# Patient Record
Sex: Female | Born: 1970 | Race: White | Hispanic: No | Marital: Married | Smoking: Never smoker
Health system: Southern US, Community
[De-identification: ages and names within clinical notes are randomized; demographics above are authoritative.]

## PROBLEM LIST (undated history)

## (undated) DIAGNOSIS — C4491 Basal cell carcinoma of skin, unspecified: Secondary | ICD-10-CM

## (undated) DIAGNOSIS — F419 Anxiety disorder, unspecified: Secondary | ICD-10-CM

## (undated) DIAGNOSIS — F988 Other specified behavioral and emotional disorders with onset usually occurring in childhood and adolescence: Secondary | ICD-10-CM

## (undated) DIAGNOSIS — G43909 Migraine, unspecified, not intractable, without status migrainosus: Secondary | ICD-10-CM

## (undated) HISTORY — DX: Migraine, unspecified, not intractable, without status migrainosus: G43.909

## (undated) HISTORY — DX: Basal cell carcinoma of skin, unspecified: C44.91

## (undated) HISTORY — PX: LEFT OOPHORECTOMY: SHX1961

## (undated) HISTORY — DX: Other specified behavioral and emotional disorders with onset usually occurring in childhood and adolescence: F98.8

## (undated) HISTORY — DX: Anxiety disorder, unspecified: F41.9

---

## 1994-06-01 HISTORY — PX: BREAST SURGERY: SHX581

## 1994-06-01 HISTORY — PX: BREAST ENHANCEMENT SURGERY: SHX7

## 1999-06-02 HISTORY — PX: ABDOMINAL HYSTERECTOMY: SHX81

## 2006-06-01 DIAGNOSIS — C4491 Basal cell carcinoma of skin, unspecified: Secondary | ICD-10-CM

## 2006-06-01 HISTORY — DX: Basal cell carcinoma of skin, unspecified: C44.91

## 2008-01-06 ENCOUNTER — Ambulatory Visit: Payer: Self-pay | Admitting: Internal Medicine

## 2008-12-23 LAB — HM PAP SMEAR

## 2009-03-29 ENCOUNTER — Ambulatory Visit: Payer: Self-pay | Admitting: Internal Medicine

## 2010-08-22 IMAGING — CR RIGHT TIBIA AND FIBULA - 2 VIEW
1 series · 2 of 2 positions shown · non-contrast
Comparison: none

REASON FOR EXAM: persistant pain rt tib/fib  US 1st
COMMENTS:

[Series 1: view not recorded · 0.17mm/px · 2 of 2 slices shown]
[im 1/2]
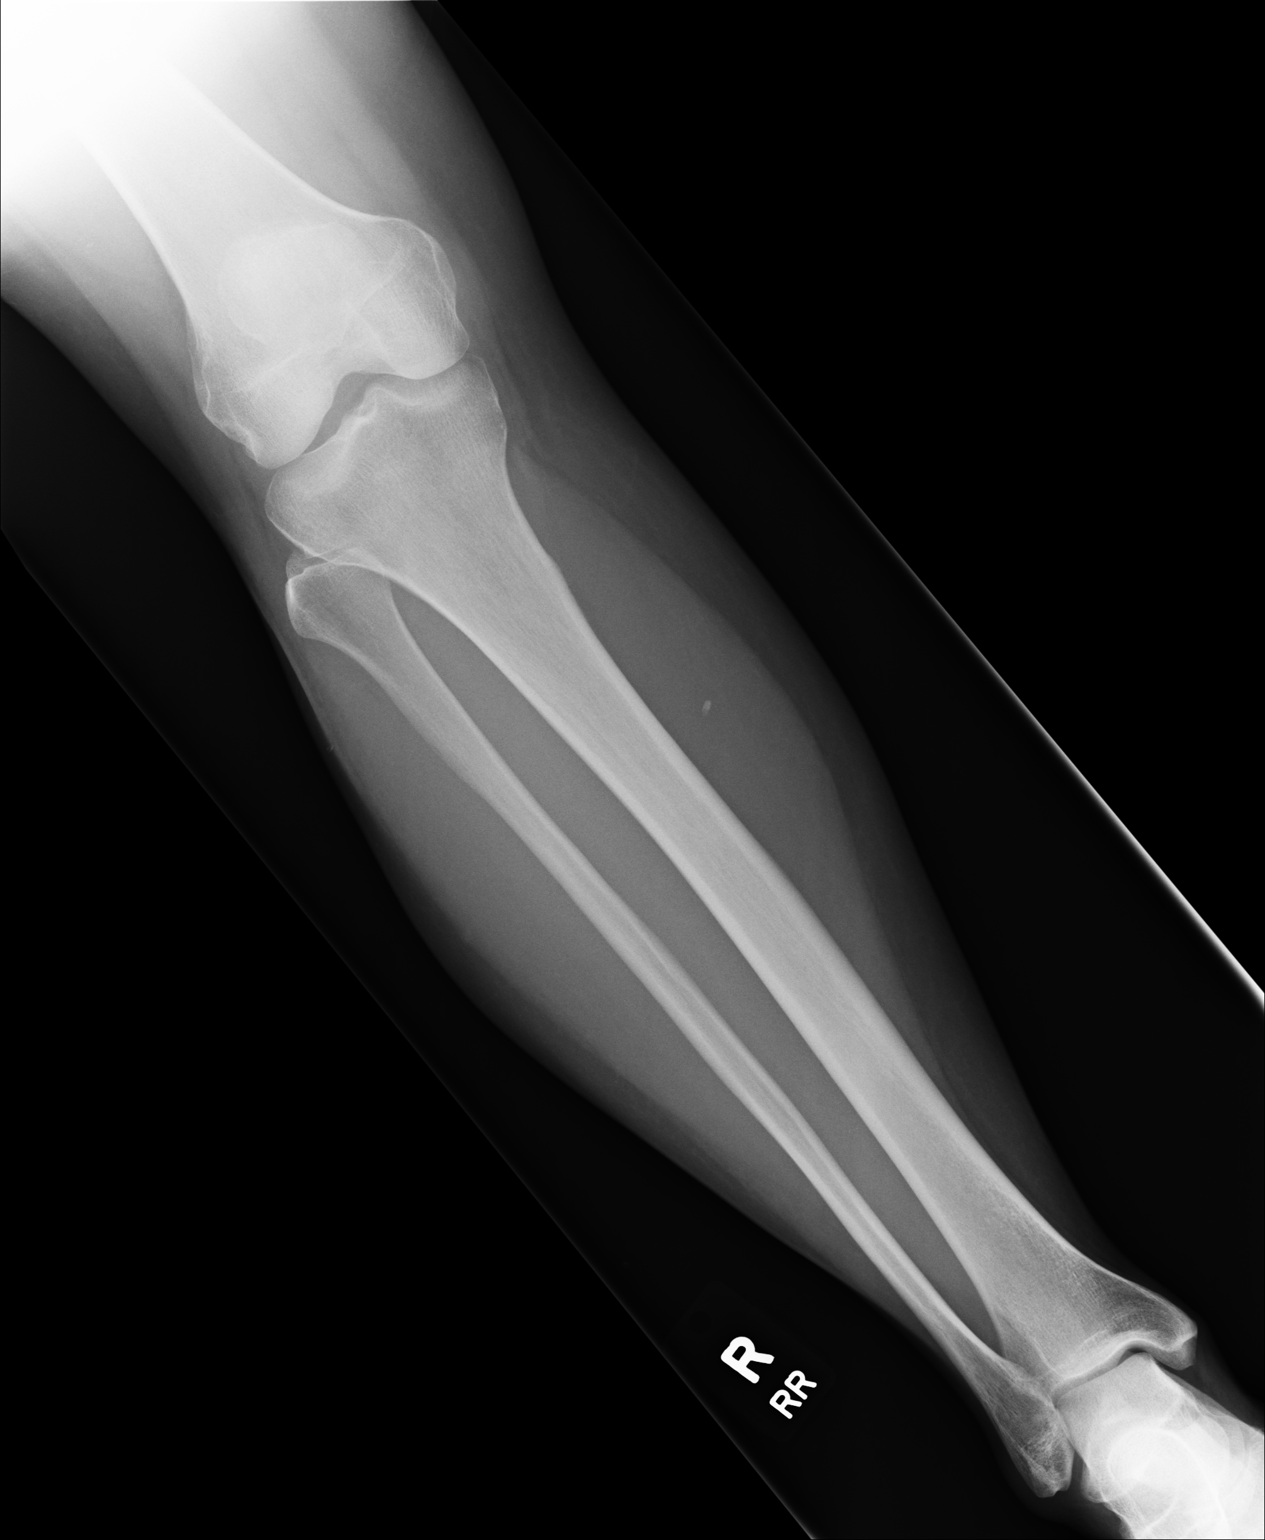
[im 2/2]
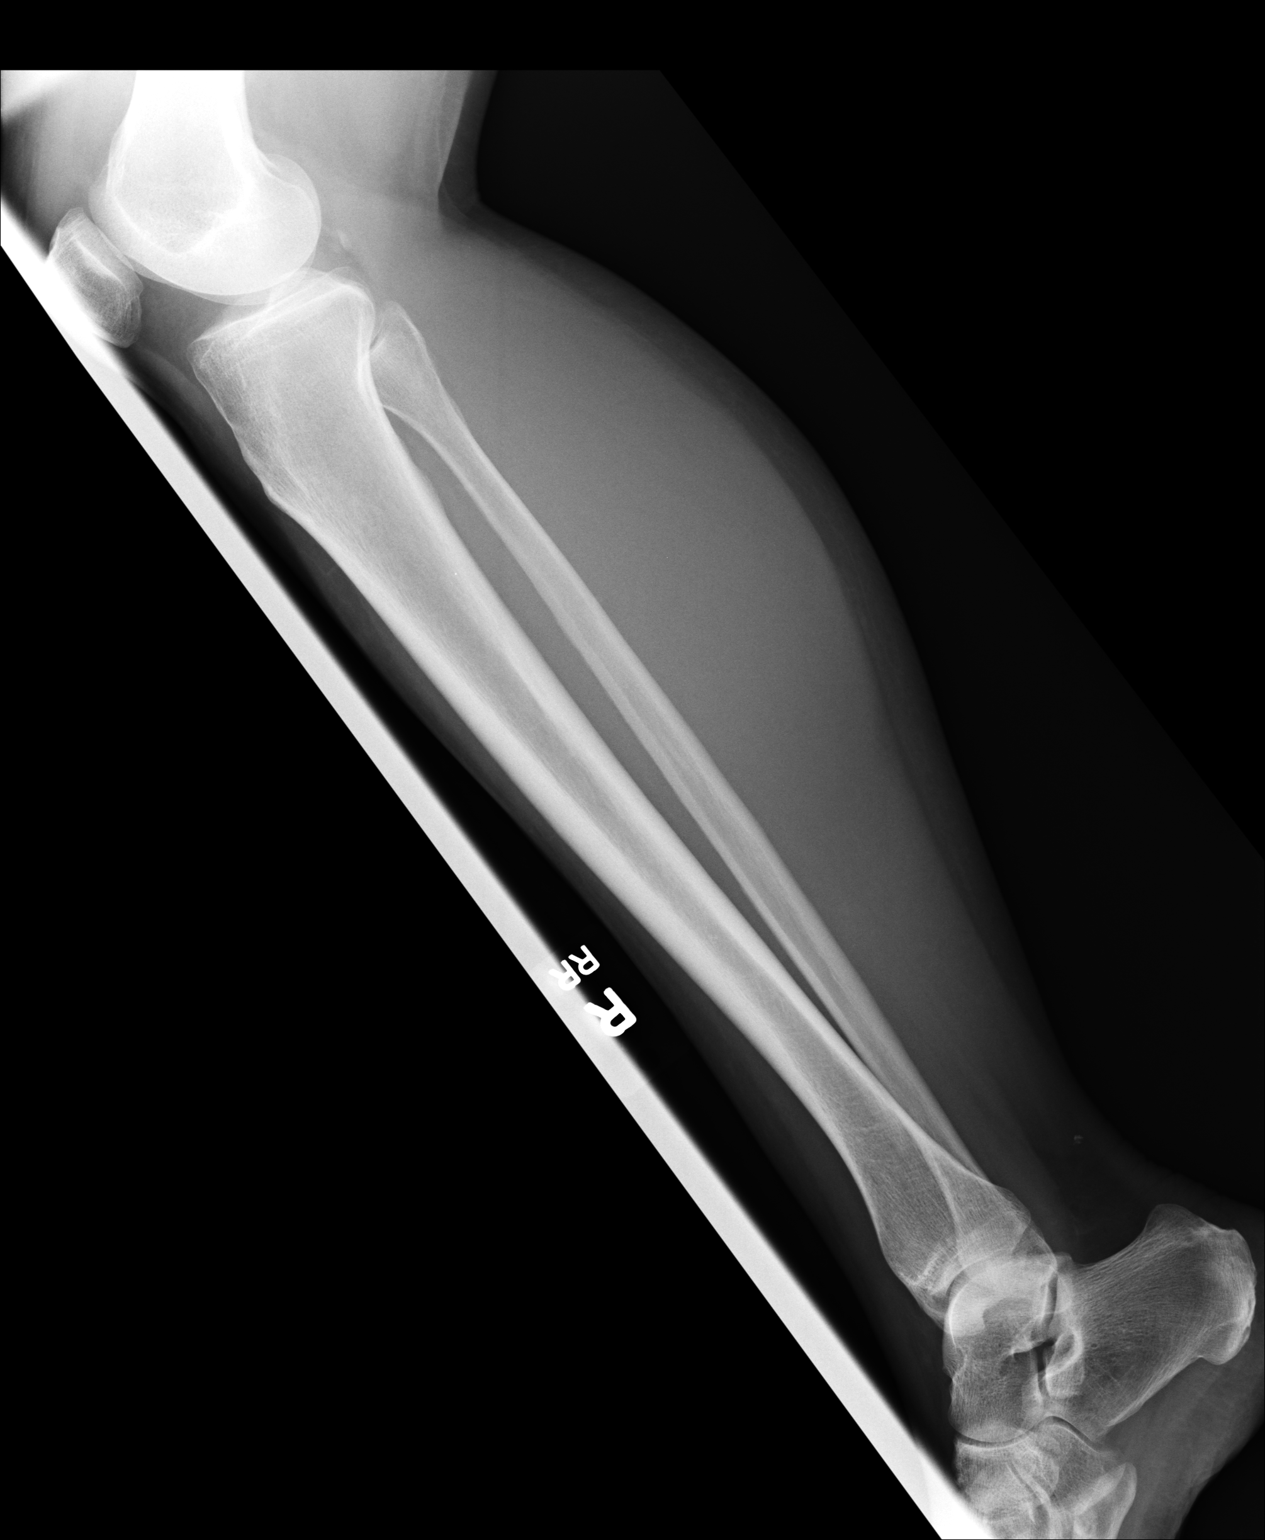

[2 of 2 positions shown; findings below may reference images not displayed]

RESULT:     AP and lateral views of the right tibia and fibula reveal the
bones to be adequately mineralized. I do not see evidence of an acute
fracture. A radiodense structure projecting over the soft tissues of the
medial aspect of the mid shin may be artifactual. It is not clearly evident
on the lateral film but it could be obscured by bony density.
IMPRESSION: I do not see evidence of an acute fracture nor healing
fracture of the right tibia or fibula. The observed portions of the knee and
ankle are normal.

## 2010-08-22 IMAGING — US TRANSABDOMINAL ULTRASOUND OF PELVIS
1 series · 17 of 25 positions shown · non-contrast
Comparison: none

REASON FOR EXAM: left tender suprainguinal mass pt has had hysterectomy
with left oophrectomy
COMMENTS:

[Series 1: transabdominal ultrasound of pelvis · 17 of 28 slices shown]
[im 1/28]
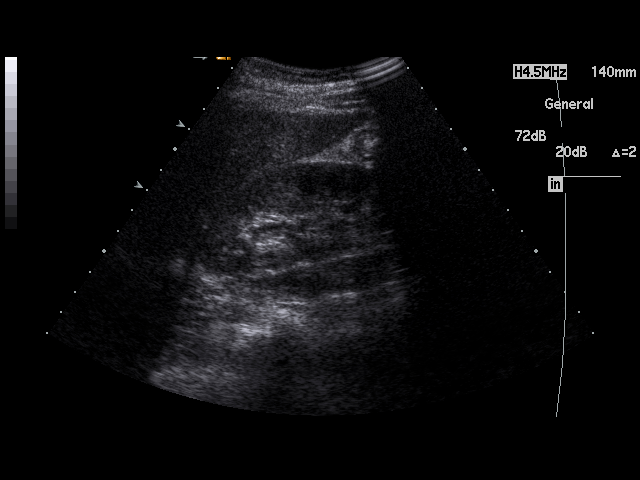
[im 3/28]
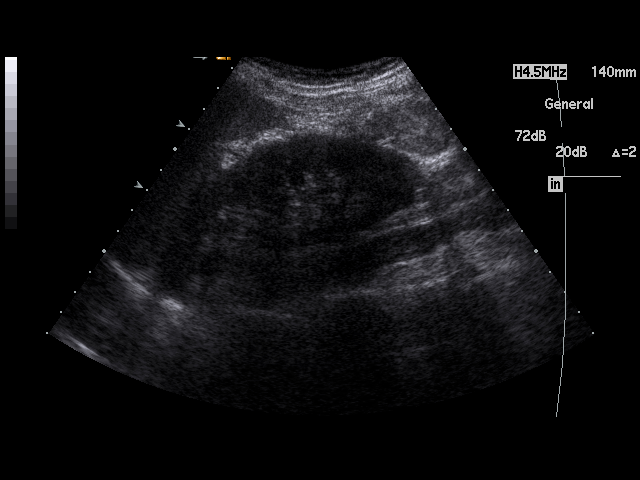
[im 4/28]
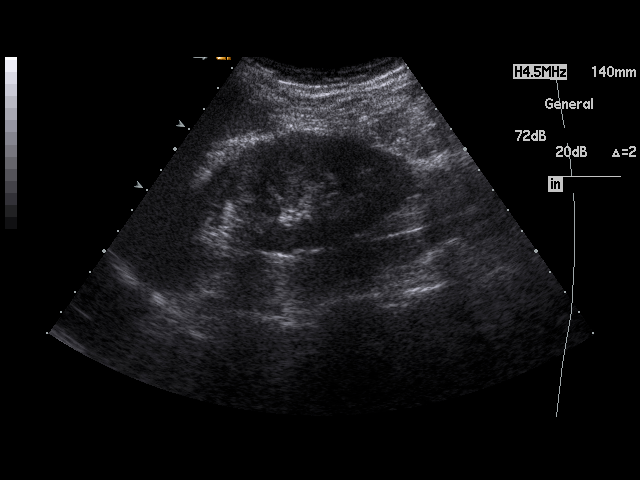
[im 6/28]
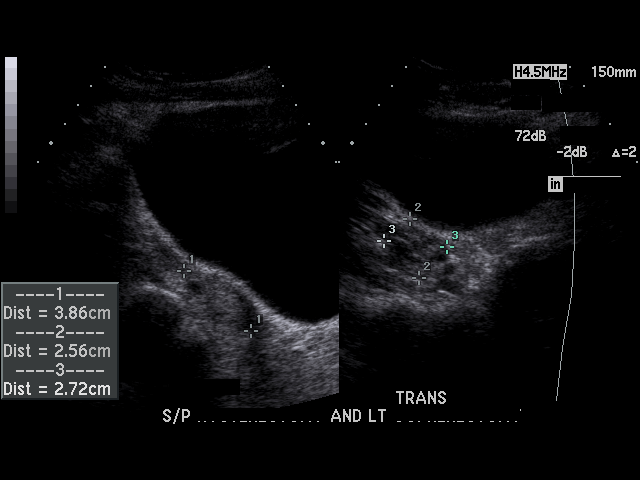
[im 7/28]
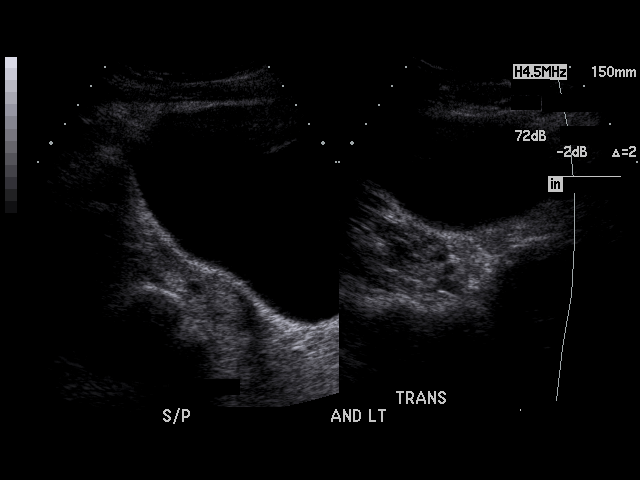
[im 10/28]
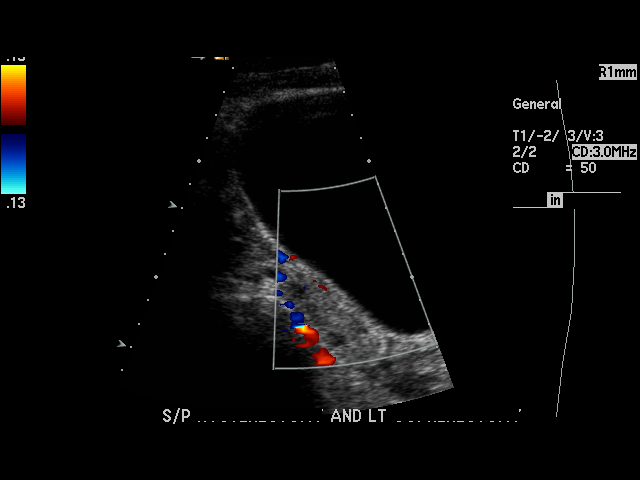
[im 11/28]
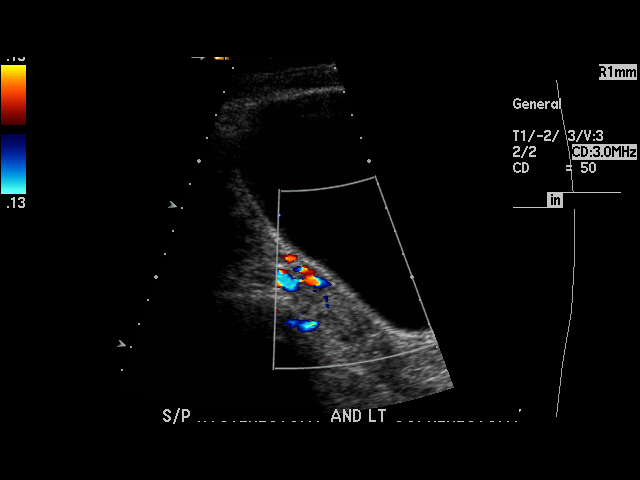
[im 13/28]
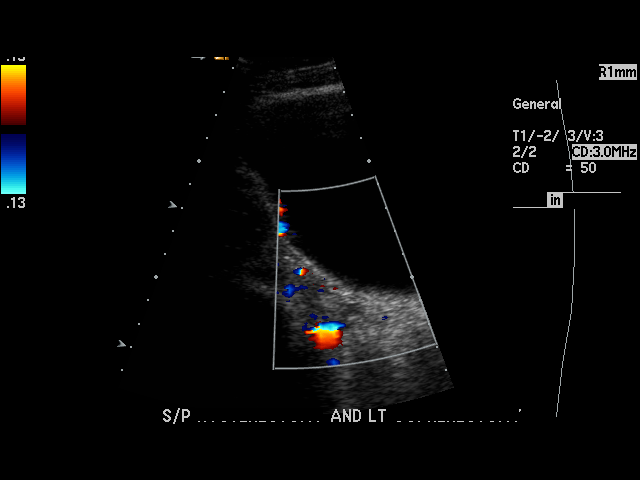
[im 14/28]
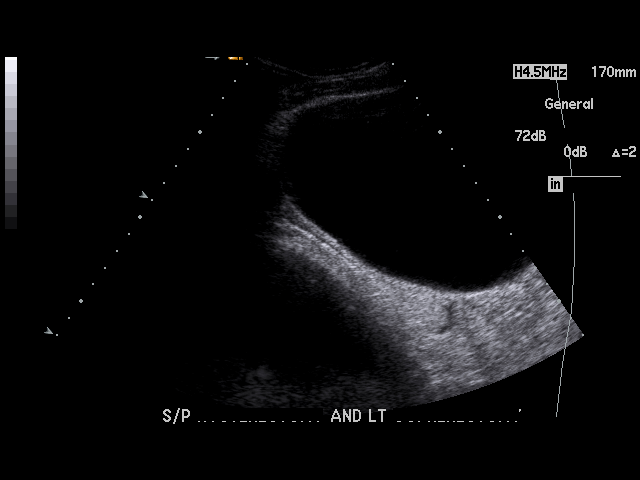
[im 15/28]
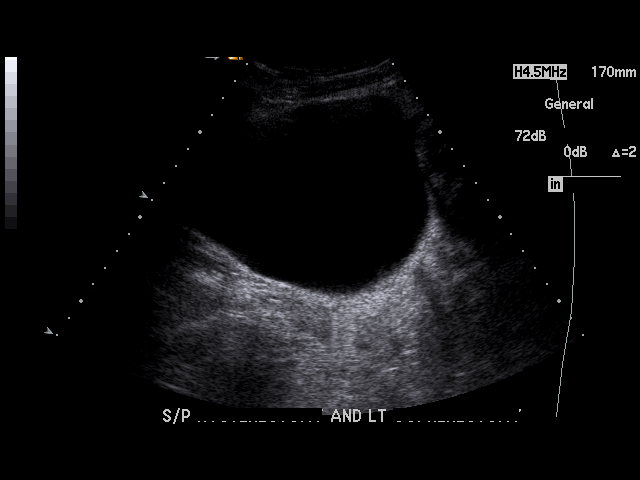
[im 17/28]
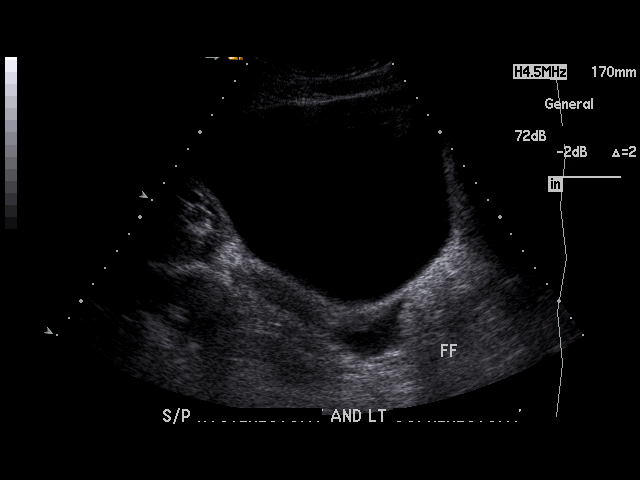
[im 19/28]
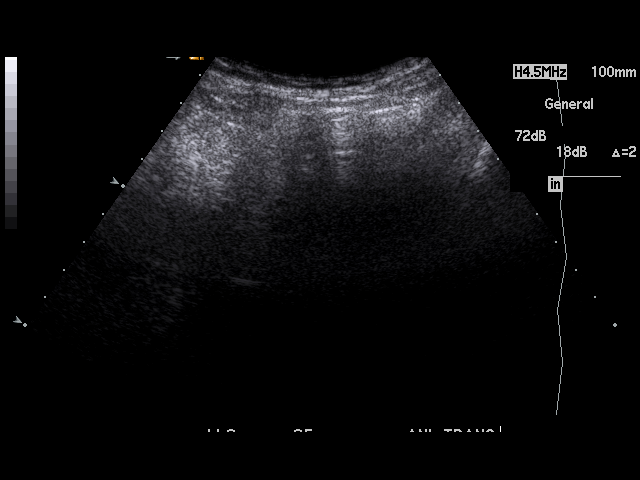
[im 21/28]
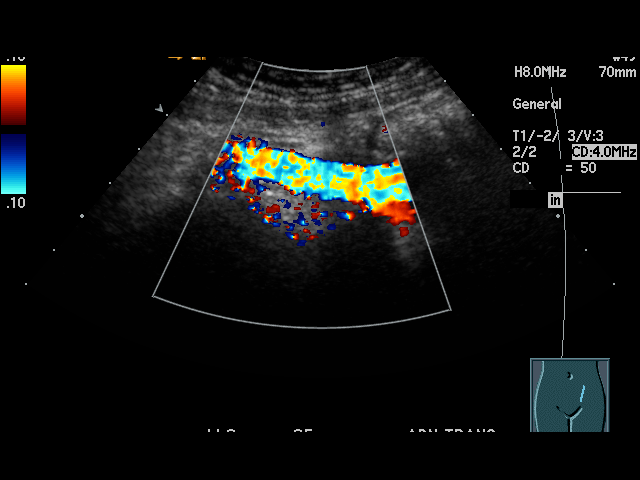
[im 22/28]
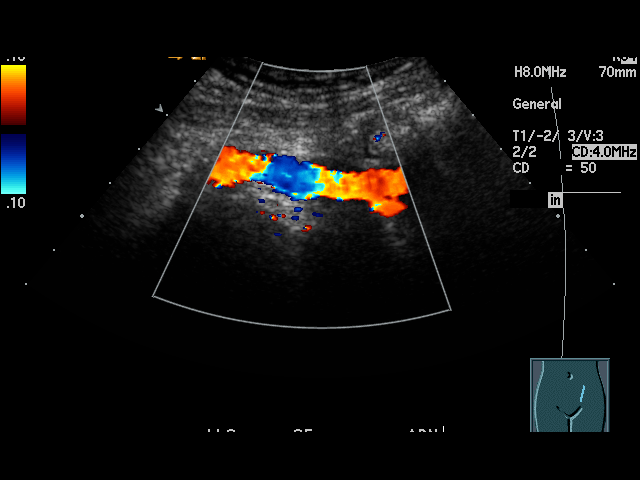
[im 24/28]
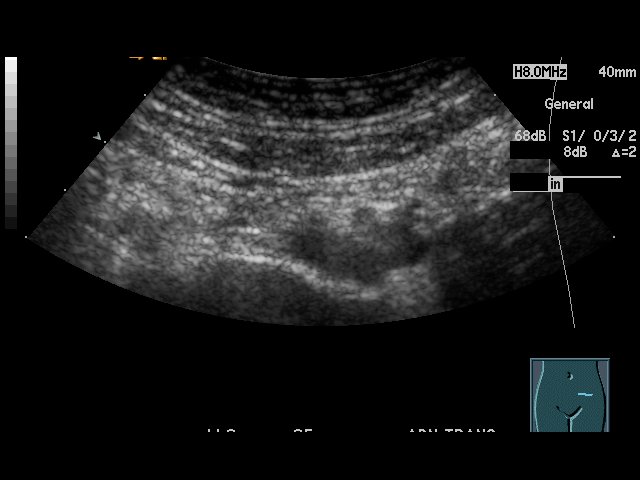
[im 25/28]
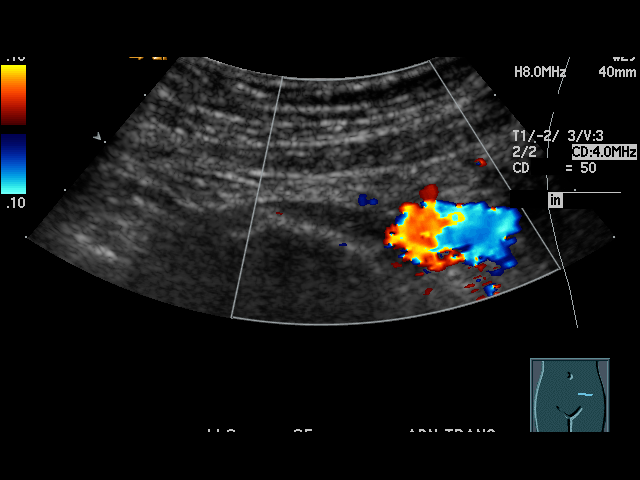
[im 28/28]
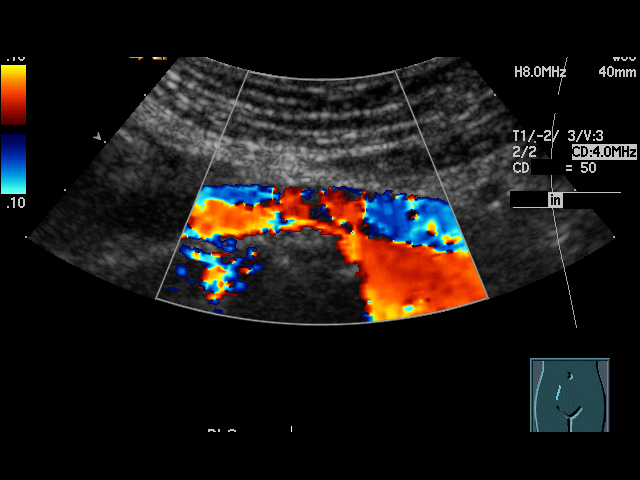

[17 of 25 positions shown; findings below may reference images not displayed]

PROCEDURE:     US  - US PELVIS EXAM  - March 29, 2009  [DATE]

RESULT:     Transabdominal pelvic sonogram is performed. The patient has
undergone hysterectomy. There is trace amount of free fluid in the
cul-de-sac. The left ovary has been removed. The right ovary measures 3.86 x
2.56 x 2.72 cm. The kidneys appear to be grossly normal.
IMPRESSION: Small amount of free fluid. This is nonspecific.
Normal-appearing right ovary.

## 2011-01-27 ENCOUNTER — Other Ambulatory Visit: Payer: Self-pay | Admitting: Internal Medicine

## 2011-01-28 MED ORDER — BUTALBITAL-APAP-CAFFEINE 50-325-40 MG PO TABS: 1.0000 | ORAL_TABLET | Freq: Every day | ORAL | Status: AC | PRN

## 2011-02-16 ENCOUNTER — Other Ambulatory Visit: Payer: Self-pay | Admitting: *Deleted

## 2011-02-16 DIAGNOSIS — F909 Attention-deficit hyperactivity disorder, unspecified type: Secondary | ICD-10-CM

## 2011-02-16 MED ORDER — METHYLPHENIDATE HCL ER (LA) 10 MG PO CP24: 10.0000 mg | ORAL_CAPSULE | ORAL | Status: DC

## 2011-02-16 NOTE — Telephone Encounter (Signed)
Patient says that she normally gets 3 rx for this at a time because she works out of town and it is really hard for her to come in and pick them up. Patient says that she needs to pick these up today because she will leaving this afternoon.

## 2011-03-04 ENCOUNTER — Other Ambulatory Visit: Payer: Self-pay | Admitting: Internal Medicine

## 2011-03-04 DIAGNOSIS — F909 Attention-deficit hyperactivity disorder, unspecified type: Secondary | ICD-10-CM

## 2011-03-04 MED ORDER — METHYLPHENIDATE HCL ER (LA) 10 MG PO CP24: 10.0000 mg | ORAL_CAPSULE | ORAL | Status: DC

## 2011-03-04 MED ORDER — METHYLPHENIDATE HCL ER 10 MG PO TBCR: 10.0000 mg | EXTENDED_RELEASE_TABLET | ORAL | Status: DC

## 2011-04-06 ENCOUNTER — Other Ambulatory Visit: Payer: Self-pay | Admitting: Internal Medicine

## 2011-04-06 NOTE — Telephone Encounter (Signed)
Patient is asking if she can get a 3 month supply.

## 2011-04-06 NOTE — Telephone Encounter (Signed)
[phone] Pt called about rx refill that needs to be written out methylphenid 10mg  e    Needs to be generic ( the name is above)  Can you write 3 rx

## 2011-04-08 ENCOUNTER — Other Ambulatory Visit: Payer: Self-pay | Admitting: Internal Medicine

## 2011-04-08 MED ORDER — METHYLPHENIDATE HCL 10 MG PO TABS: 10.0000 mg | ORAL_TABLET | Freq: Every day | ORAL | Status: DC

## 2011-04-08 MED ORDER — METHYLPHENIDATE HCL ER 10 MG PO TBCR: 10.0000 mg | EXTENDED_RELEASE_TABLET | ORAL | Status: DC

## 2011-04-08 NOTE — Telephone Encounter (Signed)
Ok to write out 3  30 days rxs for methylphenidate, unless Dr. Dan Humphreys already did it.

## 2011-06-22 ENCOUNTER — Telehealth: Payer: Self-pay | Admitting: Internal Medicine

## 2011-06-22 MED ORDER — METHYLPHENIDATE HCL 10 MG PO TABS: 10.0000 mg | ORAL_TABLET | Freq: Two times a day (BID) | ORAL | Status: DC

## 2011-06-22 NOTE — Telephone Encounter (Signed)
Patient notified that rxs are ready.  

## 2011-06-22 NOTE — Telephone Encounter (Addendum)
Refill on Ritalin 10 mg needing 3 prescriptions to put on file at the pharmacy. Patient needing this prescription today. Please notify patient when ready.

## 2011-07-06 ENCOUNTER — Telehealth: Payer: Self-pay | Admitting: Internal Medicine

## 2011-07-06 NOTE — Telephone Encounter (Signed)
Is it okay to schedule this patient who was  a patient at [facility], but hasn't been seen here yet in a 15 min slot for an acute problem. She thinks she may possibly have a bladder infection

## 2011-07-06 NOTE — Telephone Encounter (Signed)
Appt scheduled for tomorrow.  °

## 2011-07-06 NOTE — Telephone Encounter (Signed)
Yes it is okay to schedule previous patients with no recent visit in 15 minutes lots for acute visits if it is something like a sinus infection or bladder infection

## 2011-07-07 ENCOUNTER — Encounter: Payer: Self-pay | Admitting: Internal Medicine

## 2011-07-07 ENCOUNTER — Ambulatory Visit (INDEPENDENT_AMBULATORY_CARE_PROVIDER_SITE_OTHER): Payer: BC Managed Care – PPO | Admitting: Internal Medicine

## 2011-07-07 VITALS — BP 100/72 | HR 76 | Temp 98.7°F | Wt 127.0 lb

## 2011-07-07 DIAGNOSIS — R599 Enlarged lymph nodes, unspecified: Secondary | ICD-10-CM

## 2011-07-07 DIAGNOSIS — M549 Dorsalgia, unspecified: Secondary | ICD-10-CM

## 2011-07-07 DIAGNOSIS — J020 Streptococcal pharyngitis: Secondary | ICD-10-CM

## 2011-07-07 DIAGNOSIS — J029 Acute pharyngitis, unspecified: Secondary | ICD-10-CM | POA: Insufficient documentation

## 2011-07-07 DIAGNOSIS — R590 Localized enlarged lymph nodes: Secondary | ICD-10-CM

## 2011-07-07 DIAGNOSIS — F988 Other specified behavioral and emotional disorders with onset usually occurring in childhood and adolescence: Secondary | ICD-10-CM | POA: Insufficient documentation

## 2011-07-07 MED ORDER — AMOXICILLIN-POT CLAVULANATE 875-125 MG PO TABS: 1.0000 | ORAL_TABLET | Freq: Two times a day (BID) | ORAL | Status: AC

## 2011-07-07 NOTE — Assessment & Plan Note (Signed)
Lymphadenopathy is likely due to systemic infection.

## 2011-07-07 NOTE — Progress Notes (Signed)
  Subjective:    Patient ID: Nicole Camacho, female    DOB: Mar 17, 1971, 41 y.o.   MRN: [id number]  HPI Nicole Camacho is a 41 year old white female who presents with pharyngitis. Symptoms started 4 days ago and were accompanied by chills and malaise esetns with sore throat since Friday.  Chills,  Malaise,  one day prior to presentation she noticed an enlarged tender lymph node in right groin,   accompanied by low back pain and she presents for evaluation of above symptoms. She has not any recent travel or strenuous activity.  she is sexually active with her husband, but denies any pain with intercourse or vaginal discharge. No nausea vomiting or diarrhea.        Review of Systems  Constitutional: Negative for fever, chills and unexpected weight change.  HENT: Negative for hearing loss, ear pain, nosebleeds, congestion, sore throat, facial swelling, rhinorrhea, sneezing, mouth sores, trouble swallowing, neck pain, neck stiffness, voice change, postnasal drip, sinus pressure, tinnitus and ear discharge.   Eyes: Negative for pain, discharge, redness and visual disturbance.  Respiratory: Negative for cough, chest tightness, shortness of breath, wheezing and stridor.   Cardiovascular: Negative for chest pain, palpitations and leg swelling.  Musculoskeletal: Negative for myalgias and arthralgias.  Skin: Negative for color change and rash.  Neurological: Negative for dizziness, weakness, light-headedness and headaches.  Hematological: Negative for adenopathy.       Objective:   Physical Exam  Constitutional: She is oriented to person, place, and time. She appears well-developed and well-nourished.  HENT:  Mouth/Throat: Posterior oropharyngeal erythema present.    Eyes: EOM are normal. Pupils are equal, round, and reactive to light. No scleral icterus.  Neck: Normal range of motion. Neck supple. No JVD present. No thyromegaly present.  Cardiovascular: Normal rate, regular rhythm, normal heart  sounds and intact distal pulses.   Pulmonary/Chest: Effort normal and breath sounds normal.  Abdominal: Soft. Bowel sounds are normal. She exhibits no mass. There is no tenderness.  Musculoskeletal: Normal range of motion. She exhibits no edema.  Lymphadenopathy:    She has no cervical adenopathy.       Right: Inguinal adenopathy present.  Neurological: She is alert and oriented to person, place, and time.  Skin: Skin is warm and dry.  Psychiatric: She has a normal mood and affect.          Assessment & Plan:   Pharyngitis Lymphadenopathy is likely due to systemic infection.  Inguinal lymphadenopathy She has right inguinal lymphadenopathy which is tender. This is in the setting of a pharyngitis with no vaginal or urinary symptoms and a normal UA. If lymphadenopathy does not resolve with empiric treatment ofstrep pharyngitis with Augmentin and will undergo pelvic exam and transvaginal ultrasound.    Updated Medication List Outpatient Encounter Prescriptions as of 07/07/2011  Medication Sig Dispense Refill  . ALPRAZolam (XANAX) 0.25 MG tablet Take 0.25 mg by mouth every 4 (four) hours as needed.      . butalbital-acetaminophen-caffeine (FIORICET) 50-325-40 MG per tablet Take 1-2 tablets by mouth daily as needed for headache.  30 tablet  3  . methylphenidate (RITALIN) 10 MG tablet Take 1 tablet (10 mg total) by mouth 2 (two) times daily. FILL ON OR AFTER 08/20/11  60 tablet  0  . amoxicillin-clavulanate (AUGMENTIN) 875-125 MG per tablet Take 1 tablet by mouth 2 (two) times daily.  14 tablet  0

## 2011-07-07 NOTE — Assessment & Plan Note (Signed)
She has right inguinal lymphadenopathy which is tender. This is in the setting of a pharyngitis with no vaginal or urinary symptoms and a normal UA. If lymphadenopathy does not resolve with empiric treatment ofstrep pharyngitis with Augmentin and will undergo pelvic exam and transvaginal ultrasound.

## 2011-07-08 LAB — URINALYSIS, ROUTINE W REFLEX MICROSCOPIC
Ketones, ur: NEGATIVE mg/dL
Leukocytes, UA: NEGATIVE
Nitrite: NEGATIVE
Specific Gravity, Urine: 1.012 (ref 1.005–1.030)
pH: 7 (ref 5.0–8.0)

## 2011-08-21 ENCOUNTER — Encounter: Payer: Self-pay | Admitting: Internal Medicine

## 2011-09-03 ENCOUNTER — Encounter: Payer: Self-pay | Admitting: Internal Medicine

## 2011-10-19 ENCOUNTER — Encounter: Payer: Self-pay | Admitting: Internal Medicine

## 2011-10-19 ENCOUNTER — Ambulatory Visit (INDEPENDENT_AMBULATORY_CARE_PROVIDER_SITE_OTHER): Payer: BC Managed Care – PPO | Admitting: Internal Medicine

## 2011-10-19 VITALS — BP 105/73 | HR 78 | Temp 97.8°F | Resp 14 | Ht 63.5 in | Wt 125.0 lb

## 2011-10-19 DIAGNOSIS — Z13220 Encounter for screening for lipoid disorders: Secondary | ICD-10-CM

## 2011-10-19 DIAGNOSIS — Z0000 Encounter for general adult medical examination without abnormal findings: Secondary | ICD-10-CM | POA: Insufficient documentation

## 2011-10-19 DIAGNOSIS — R5381 Other malaise: Secondary | ICD-10-CM

## 2011-10-19 DIAGNOSIS — R5383 Other fatigue: Secondary | ICD-10-CM

## 2011-10-19 DIAGNOSIS — Z1239 Encounter for other screening for malignant neoplasm of breast: Secondary | ICD-10-CM

## 2011-10-19 NOTE — Assessment & Plan Note (Signed)
Routine pelvic was done without PAP.  She is s/p TAH LSO

## 2011-10-19 NOTE — Progress Notes (Signed)
Patient ID: Nicole Camacho, female   DOB: [birth year], 41 y.o.   MRN: [id number] Subjective:     Nicole Camacho is a 41 y.o. female here for a routine exam.  Current complaints: none.  Personal health questionnaire reviewed: yes.   Gynecologic History No LMP recorded. Patient has had a hysterectomy. Contraception: none Last Pap: 2010. Results were: consistent with hysterectomy Last mammogram: 2012. Results were: normal  Obstetric History OB History    Grav Para Term Preterm Abortions TAB SAB Ect Mult Living                   The following portions of the patient's history were reviewed and updated as appropriate: allergies, current medications, past family history, past medical history, past social history, past surgical history and problem list.  Review of Systems A comprehensive review of systems was negative.    Objective:    BP 105/73  Pulse 78  Temp(Src) 97.8 F (36.6 C) (Oral)  Resp 14  Ht 5' 3.5" (1.613 m)  Wt 125 lb (56.7 kg)  BMI 21.80 kg/m2  SpO2 100%  General Appearance:    Alert, cooperative, no distress, appears stated age  Head:    Normocephalic, without obvious abnormality, atraumatic  Eyes:    PERRL, conjunctiva/corneas clear, EOM's intact, fundi    benign, both eyes  Ears:    Normal TM's and external ear canals, both ears  Nose:   Nares normal, septum midline, mucosa normal, no drainage    or sinus tenderness  Throat:   Lips, mucosa, and tongue normal; teeth and gums normal  Neck:   Supple, symmetrical, trachea midline, no adenopathy;    thyroid:  no enlargement/tenderness/nodules; no carotid   bruit or JVD  Back:     Symmetric, no curvature, ROM normal, no CVA tenderness  Lungs:     Clear to auscultation bilaterally, respirations unlabored  Chest Wall:    No tenderness or deformity   Heart:    Regular rate and rhythm, S1 and S2 normal, no murmur, rub   or gallop  Breast Exam:    No tenderness, masses, or nipple abnormality  Abdomen:     Soft,  non-tender, bowel sounds active all four quadrants,    no masses, no organomegaly  Genitalia:    Normal female without lesion, discharge or tenderness  Rectal:    Normal tone, normal prostate, no masses or tenderness;   guaiac negative stool  Extremities:   Extremities normal, atraumatic, no cyanosis or edema  Pulses:   2+ and symmetric all extremities  Skin:   Skin color, texture, turgor normal, no rashes or lesions  Lymph nodes:   Cervical, supraclavicular, and axillary nodes normal  Neurologic:   CNII-XII intact, normal strength, sensation and reflexes    throughout      Assessment:    Healthy female exam.    Plan:    Mammogram ordered.

## 2011-10-19 NOTE — Patient Instructions (Signed)
Consider the Low Glycemic Index Diet and 6 smaller meals daily .  This boosts your metabolism and regulates your sugars:   7 AM Low carbohydrate Protein  Shakes (EAS Carb Control  Or Atkins ,  Available everywhere,   In  cases at [employer] )  2.5 carbs  (Add or substitute a toasted sandwhich thin w/ peanut butter)  10 AM: Protein bar by Atkins (snack size,  Chocolate lover's variety at  [employer])    Lunch: sandwich on pita bread or flatbread (Joseph's makes a pita bread and a flat bread , available at [employer] and [employer]; Toufayah makes a low carb flatbread available at [employer] and [employer]) Mission makes a low carb whole wheat tortilla available at [employer],and most grocery stores   3 PM:  Mid day :  Another protein bar,  Or a  cheese stick, 1/4 cup of almonds, walnuts, pistachios, pecans, peanuts,  Macadamia nuts  6 PM  Dinner:  "mean and green:"  Meat/chicken/fish, salad, and green veggie : use ranch, vinagrette,  Blue cheese, etc  9 PM snack : Breyer's low carb fudgsicle or  ice cream bar (Carb Smart), or  Weight Watcher's ice cream bar , or another protein shake  

## 2011-11-23 ENCOUNTER — Telehealth: Payer: Self-pay | Admitting: Internal Medicine

## 2011-11-23 NOTE — Telephone Encounter (Signed)
Letter on printer for labs  

## 2011-11-23 NOTE — Telephone Encounter (Signed)
Pt would like order to get labs done at the city of [place]

## 2011-11-24 NOTE — Telephone Encounter (Signed)
Patient notified letter is ready

## 2011-11-26 ENCOUNTER — Other Ambulatory Visit: Payer: BC Managed Care – PPO

## 2011-12-24 LAB — HM MAMMOGRAPHY: HM Mammogram: NORMAL

## 2012-01-03 ENCOUNTER — Telehealth: Payer: Self-pay | Admitting: Internal Medicine

## 2012-01-03 NOTE — Telephone Encounter (Signed)
Labs reviewed.  all are normal.

## 2012-01-04 NOTE — Telephone Encounter (Signed)
Left message asking patient to call back

## 2012-01-04 NOTE — Telephone Encounter (Signed)
Patient notified

## 2012-01-05 ENCOUNTER — Encounter: Payer: Self-pay | Admitting: Internal Medicine

## 2012-02-02 ENCOUNTER — Other Ambulatory Visit: Payer: Self-pay | Admitting: Internal Medicine

## 2012-02-02 NOTE — Telephone Encounter (Signed)
Refill on Methylphenid 10 mg E.

## 2012-02-03 MED ORDER — METHYLPHENIDATE HCL 10 MG PO TABS: 10.0000 mg | ORAL_TABLET | Freq: Two times a day (BID) | ORAL | Status: DC

## 2012-02-03 NOTE — Telephone Encounter (Signed)
Rx printed and given to Dr. Dan Humphreys to sign.  Patient advised via message left on cell phone voicemail that Rx is ready for pick up will be left at front desk.

## 2012-02-03 NOTE — Telephone Encounter (Signed)
Fine to refill x1 

## 2012-02-03 NOTE — Telephone Encounter (Signed)
Would you be willing to refill for patient?

## 2012-02-12 ENCOUNTER — Other Ambulatory Visit: Payer: Self-pay | Admitting: *Deleted

## 2012-02-12 MED ORDER — METHYLPHENIDATE HCL ER 10 MG PO TBCR: 10.0000 mg | EXTENDED_RELEASE_TABLET | ORAL | Status: DC

## 2012-02-12 NOTE — Telephone Encounter (Signed)
Three Rx's printed, Dr. Darrick Huntsman signed them and they were given to patient.

## 2012-02-22 ENCOUNTER — Encounter: Payer: Self-pay | Admitting: Internal Medicine

## 2012-02-22 ENCOUNTER — Ambulatory Visit (INDEPENDENT_AMBULATORY_CARE_PROVIDER_SITE_OTHER): Payer: BC Managed Care – PPO | Admitting: Internal Medicine

## 2012-02-22 VITALS — BP 104/64 | HR 80 | Temp 98.6°F | Wt 124.8 lb

## 2012-02-22 DIAGNOSIS — R55 Syncope and collapse: Secondary | ICD-10-CM | POA: Insufficient documentation

## 2012-02-22 MED ORDER — ALPRAZOLAM 0.25 MG PO TABS: 0.2500 mg | ORAL_TABLET | Freq: Every evening | ORAL | Status: DC | PRN
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
## 2012-02-22 NOTE — Assessment & Plan Note (Signed)
With low resting blood pressure, recent stressful events.  EKG, cardiac and neurological exam normal today.  If syncope recures,  Will refer for tilt table testing and Cardionet.  Advised to increase salt in diet.

## 2012-03-21 ENCOUNTER — Other Ambulatory Visit: Payer: Self-pay | Admitting: Internal Medicine

## 2012-03-21 NOTE — Telephone Encounter (Signed)
Typo Rx called in to [facility] pharmacy.

## 2012-03-21 NOTE — Telephone Encounter (Signed)
Ok to refill,  Authorized in epic 

## 2012-03-21 NOTE — Telephone Encounter (Signed)
Rx called into [facility] pharmacy. 

## 2012-04-01 ENCOUNTER — Telehealth: Payer: Self-pay | Admitting: Internal Medicine

## 2012-04-01 NOTE — Telephone Encounter (Signed)
Spoke to patient to let her know where we were at with the prior authorization she stated that she will call on Monday to follow up.

## 2012-04-01 NOTE — Telephone Encounter (Signed)
Prior auth required on methylphenid 10 mg e tab Cleotis Lema Qty: 30 I have called [phone] to get the PA form faxed.  The id for patient is [id number] I [facility] with [facility] will [facility].

## 2012-04-05 ENCOUNTER — Telehealth: Payer: Self-pay | Admitting: Internal Medicine

## 2012-04-05 NOTE — Telephone Encounter (Signed)
Pt called Checking on prior approval for her rx   Please advise  Pt has only 2 pills left

## 2012-04-05 NOTE — Telephone Encounter (Signed)
Pt was calling back she was needing a refill please call her Cell [phone] and she said to leave a detailed message because she is going to an appointment now and may not be able to answer.

## 2012-04-06 NOTE — Telephone Encounter (Signed)
Left message on patient Vm that prior authorization went through and to check with the pharmacy to see when Rx would be ready for pickup.

## 2012-07-28 ENCOUNTER — Telehealth: Payer: Self-pay | Admitting: Internal Medicine

## 2012-07-28 NOTE — Telephone Encounter (Signed)
Pt needing refill on Methylphenidate [facility]CI ER 10 mg. May need Authorization. Normally gets 3 months worth.

## 2012-07-29 MED ORDER — METHYLPHENIDATE HCL ER 10 MG PO TBCR: 10.0000 mg | EXTENDED_RELEASE_TABLET | ORAL | Status: DC

## 2012-07-29 MED ORDER — METHYLPHENIDATE HCL 10 MG PO TABS: 10.0000 mg | ORAL_TABLET | Freq: Two times a day (BID) | ORAL | Status: DC

## 2012-07-29 NOTE — Telephone Encounter (Signed)
Ok to print out 3 30 day rxs for methylphenidate ,  i will sign

## 2012-07-29 NOTE — Telephone Encounter (Signed)
Pt notified Rx ready for pickup 

## 2012-07-29 NOTE — Telephone Encounter (Signed)
Med printed 

## 2012-08-01 ENCOUNTER — Other Ambulatory Visit: Payer: Self-pay | Admitting: Internal Medicine

## 2012-08-01 MED ORDER — METHYLPHENIDATE HCL ER 10 MG PO TBCR: 10.0000 mg | EXTENDED_RELEASE_TABLET | ORAL | Status: DC

## 2012-12-12 ENCOUNTER — Other Ambulatory Visit: Payer: Self-pay | Admitting: *Deleted

## 2012-12-13 MED ORDER — ALPRAZOLAM 0.25 MG PO TABS: 0.2500 mg | ORAL_TABLET | Freq: Every evening | ORAL | Status: DC | PRN

## 2012-12-22 ENCOUNTER — Other Ambulatory Visit: Payer: Self-pay | Admitting: *Deleted

## 2013-01-09 MED ORDER — ALPRAZOLAM 0.25 MG PO TABS: 0.2500 mg | ORAL_TABLET | Freq: Every evening | ORAL | Status: DC | PRN

## 2013-01-20 ENCOUNTER — Encounter: Payer: Self-pay | Admitting: *Deleted

## 2013-01-23 ENCOUNTER — Ambulatory Visit (INDEPENDENT_AMBULATORY_CARE_PROVIDER_SITE_OTHER): Payer: BC Managed Care – PPO | Admitting: Internal Medicine

## 2013-01-23 ENCOUNTER — Encounter: Payer: Self-pay | Admitting: *Deleted

## 2013-01-23 ENCOUNTER — Encounter: Payer: Self-pay | Admitting: Internal Medicine

## 2013-01-23 VITALS — BP 106/68 | HR 87 | Temp 97.3°F | Resp 14 | Ht 63.0 in | Wt 123.0 lb

## 2013-01-23 DIAGNOSIS — F988 Other specified behavioral and emotional disorders with onset usually occurring in childhood and adolescence: Secondary | ICD-10-CM

## 2013-01-23 DIAGNOSIS — R51 Headache: Secondary | ICD-10-CM

## 2013-01-23 DIAGNOSIS — Z1239 Encounter for other screening for malignant neoplasm of breast: Secondary | ICD-10-CM

## 2013-01-23 DIAGNOSIS — Z0000 Encounter for general adult medical examination without abnormal findings: Secondary | ICD-10-CM

## 2013-01-23 MED ORDER — ALPRAZOLAM 0.25 MG PO TABS: 0.2500 mg | ORAL_TABLET | Freq: Every evening | ORAL | Status: DC | PRN

## 2013-01-23 MED ORDER — METHYLPHENIDATE HCL ER 10 MG PO TBCR: 10.0000 mg | EXTENDED_RELEASE_TABLET | ORAL | Status: DC

## 2013-01-23 NOTE — Progress Notes (Signed)
Patient ID: Nicole Camacho, female   DOB: 06/19/1970, 42 y.o.   MRN: [id number]  A Subjective:     Nicole Camacho is a 42 y.o. female and is here for a comprehensive physical exam. The patient reports no problems. She continues to have headaches but they are occurring 3 times per week treated with over-the-counter medications most of the time. She is a migraine about once a month for which she uses for Fioricet   History   Social History  . Marital Status: Married    Spouse Name: N/A    Number of Children: N/A  . Years of Education: N/A   Occupational History  . Not on file.   Social History Main Topics  . Smoking status: Never Smoker   . Smokeless tobacco: Not on file  . Alcohol Use: Yes     Comment: ocassional exacerbates headaches  . Drug Use: Not on file  . Sexual Activity: Not on file   Other Topics Concern  . Not on file   Social History Narrative  . No narrative on file   Health Maintenance  Topic Date Due  . Tetanus/tdap  03/19/1989  . Pap Smear  03/22/2012  . Influenza Vaccine  01/30/2013    The following portions of the patient's history were reviewed and updated as appropriate: allergies, current medications, past family history, past medical history, past social history, past surgical history and problem list.  Review of Systems A comprehensive review of systems was negative.   Objective:   BP 106/68  Pulse 87  Temp(Src) 97.3 F (36.3 C) (Oral)  Resp 14  Ht 5\' 3"  (1.6 m)  Wt 123 lb (55.792 kg)  BMI 21.79 kg/m2  SpO2 98%  General Appearance:    Alert, cooperative, no distress, appears stated age  Head:    Normocephalic, without obvious abnormality, atraumatic  Eyes:    PERRL, conjunctiva/corneas clear, EOM's intact, fundi    benign, both eyes  Ears:    Normal TM's and external ear canals, both ears  Nose:   Nares normal, septum midline, mucosa normal, no drainage    or sinus tenderness  Throat:   Lips, mucosa, and tongue normal; teeth and  gums normal  Neck:   Supple, symmetrical, trachea midline, no adenopathy;    thyroid:  no enlargement/tenderness/nodules; no carotid   bruit or JVD  Back:     Symmetric, no curvature, ROM normal, no CVA tenderness  Lungs:     Clear to auscultation bilaterally, respirations unlabored  Chest Wall:    No tenderness or deformity   Heart:    Regular rate and rhythm, S1 and S2 normal, no murmur, rub   or gallop  Breast Exam:    No tenderness, masses, or nipple abnormality  Abdomen:     Soft, non-tender, bowel sounds active all four quadrants,    no masses, no organomegaly  Genitalia:    Pelvic:  Cervix and uterus surgically absent, no adnexal masses or tenderness, rectovaginal septum normal, vagina normal without discharge  Extremities:   Extremities normal, atraumatic, no cyanosis or edema  Pulses:   2+ and symmetric all extremities  Skin:   Skin color, texture, turgor normal, no rashes or lesions  Lymph nodes:   Cervical, supraclavicular, and axillary nodes normal  Neurologic:   CNII-XII intact, normal strength, sensation and reflexes    throughout    Assessment:   Annual physical exam Annual comprehensive exam was done including breast, pelvic without Pap smear. She is  status post hysterectomy. Both ovaries nonpalpable.. All screenings have been addressed .   Headache disorder Her migraines are infrequent and manage with Fioricet. Her frequent weekly headaches appear to be tension related. We discussed preventive medications were given her resting blood pressure she's not likely tolerate these.  Attention deficit disorder of adult Managed well with current medications. There has been no escalation in use. 3 months of refills given.    Updated Medication List Outpatient Encounter Prescriptions as of 01/23/2013  Medication Sig Dispense Refill  . ALPRAZolam (XANAX) 0.25 MG tablet Take 1 tablet (0.25 mg total) by mouth at bedtime as needed for sleep or anxiety.  30 tablet  3  .  butalbital-acetaminophen-caffeine (FIORICET WITH CODEINE) 50-325-40-30 MG per capsule Take 1 capsule by mouth every 4 (four) hours as needed.      . butalbital-acetaminophen-caffeine (FIORICET, ESGIC) 50-325-40 MG per tablet TAKE 1-2 TABLETS BY MOUTH DAILY AS NEEDED FOR HEADACHE.  60 tablet  3  . methylphenidate (METADATE ER) 10 MG ER tablet Take 1 tablet (10 mg total) by mouth every morning.  30 tablet  0  . methylphenidate 10 MG ER tablet Take 1 tablet (10 mg total) by mouth every morning.  60 tablet  0  . [DISCONTINUED] ALPRAZolam (XANAX) 0.25 MG tablet Take 1 tablet (0.25 mg total) by mouth at bedtime as needed for sleep or anxiety.  30 tablet  3  . [DISCONTINUED] methylphenidate (METADATE ER) 10 MG ER tablet Take 1 tablet (10 mg total) by mouth every morning.  60 tablet  0  . [DISCONTINUED] methylphenidate 10 MG ER tablet Take 1 tablet (10 mg total) by mouth every morning.  60 tablet  0  . [DISCONTINUED] methylphenidate 10 MG ER tablet Take 1 tablet (10 mg total) by mouth every morning.  60 tablet  0  . cyanocobalamin 1000 MCG tablet Take 100 mcg by mouth daily.      . Multiple Vitamin (MULTIVITAMIN) tablet Take 1 tablet by mouth daily.      . NON FORMULARY Take 2 drops by mouth daily. ES8 & ESR Supplement.       No facility-administered encounter medications on file as of 01/23/2013.

## 2013-01-23 NOTE — Patient Instructions (Addendum)
Sign up for mychart

## 2013-01-24 NOTE — Assessment & Plan Note (Signed)
Her migraines are infrequent and manage with Fioricet. Her frequent weekly headaches appear to be tension related. We discussed preventive medications were given her resting blood pressure she's not likely tolerate these.

## 2013-01-24 NOTE — Assessment & Plan Note (Addendum)
Managed well with current medications. There has been no escalation in use. 3 months of refills given.       

## 2013-01-24 NOTE — Assessment & Plan Note (Signed)
Annual comprehensive exam was done including breast, pelvic without Pap smear. She is status post hysterectomy. Both ovaries nonpalpable.. All screenings have been addressed .

## 2013-02-02 ENCOUNTER — Telehealth: Payer: Self-pay | Admitting: Internal Medicine

## 2013-02-02 MED ORDER — METHYLPHENIDATE HCL ER 10 MG PO TBCR: 10.0000 mg | EXTENDED_RELEASE_TABLET | ORAL | Status: DC

## 2013-02-02 NOTE — Telephone Encounter (Signed)
Please refill her methylphenidate for 4 months using today's date. May refill after September 4 October 4 November 4 and December 4.  Thank you I will sign her mother will pick him up tomorrow. Her mother is my patient also.

## 2013-02-02 NOTE — Telephone Encounter (Signed)
Ok to refill? Last OV 01/23/13 

## 2013-02-02 NOTE — Telephone Encounter (Signed)
Scripts printed as requested. Double printed one marked through and presented to Dr. Darrick Huntsman to be shredded.

## 2013-02-02 NOTE — Telephone Encounter (Signed)
The patient waited over 180 days to have one of her prescriptions filled and it expired she is needing new prescriptions for her methylphenidate 10 MG ER tablet  Dated 8.25.14 Fill after 9.25.14 Fill after 10.25.14 Fill after 11.25.14  [id number]  Her mother Mrs. Ernestina Penna will be by tomorrow to pick them up and drop off the old prescriptions.

## 2013-02-03 NOTE — Telephone Encounter (Signed)
Placed up front for pick up by patient mother, okayed to pick up per Dr. Darrick Huntsman

## 2013-03-17 ENCOUNTER — Telehealth: Payer: Self-pay | Admitting: Internal Medicine

## 2013-03-17 NOTE — Telephone Encounter (Signed)
States she received information from her insurance company that [facility] uses a laboratory that is out of network with her insurance, however Adult nurse is Therapist, [employer].  Pt asking if this is going to be a problem and if she needs to start getting her labwork done elsewhere/[facility] in the future.  Pt does want to stay with Dr. Darrick Huntsman and would prefer to be able to have her labs done here, but can go elsewhere for labs if necessary.  [employer] plan.

## 2013-03-17 NOTE — Telephone Encounter (Signed)
Left message that pt can always get an order for her labs to be drawn where her insurance prefers.

## 2013-03-21 ENCOUNTER — Encounter: Payer: Self-pay | Admitting: *Deleted

## 2013-03-21 ENCOUNTER — Telehealth: Payer: Self-pay | Admitting: Internal Medicine

## 2013-03-21 DIAGNOSIS — E559 Vitamin D deficiency, unspecified: Secondary | ICD-10-CM | POA: Insufficient documentation

## 2013-03-21 NOTE — Telephone Encounter (Signed)
All labs normal,. Including thyroid function and cholesterol  Your vitamin D is low at 25 (normal range is 30 to 100) Low D , which can increase your risk of weak bones and fractures and interfere with your body's ability to absorb the calcium in your diet.   I am recommending that you start taking an OTC  Vit D3 supplement 2000 units daily.   Regards,  Dr. Darrick Huntsman

## 2013-03-21 NOTE — Telephone Encounter (Signed)
Letter mailed

## 2013-04-04 ENCOUNTER — Telehealth: Payer: Self-pay | Admitting: Internal Medicine

## 2013-04-04 NOTE — Telephone Encounter (Signed)
Needing prior authorization for her   methylphenidate 10 MG ER tablet

## 2013-04-07 NOTE — Telephone Encounter (Signed)
Nicole Camacho can you start this prior authorization?

## 2013-04-13 NOTE — Telephone Encounter (Signed)
Called [facility] pharmacy and they stated they didn't need a Prior authorization that they needed a new Rx since it has expired

## 2013-04-14 MED ORDER — METHYLPHENIDATE HCL ER 10 MG PO TBCR: 10.0000 mg | EXTENDED_RELEASE_TABLET | ORAL | Status: DC

## 2013-04-14 NOTE — Telephone Encounter (Signed)
Last OV 8/14 Patient script formethylphenidate 10 MG ER tablet has expired okay to fill  ?

## 2013-04-14 NOTE — Telephone Encounter (Signed)
Patient needs prior authorization for [facility] per patient , Please advise.

## 2013-04-14 NOTE — Telephone Encounter (Signed)
Yes ok to refill 

## 2013-04-19 DIAGNOSIS — Z0279 Encounter for issue of other medical certificate: Secondary | ICD-10-CM

## 2013-04-20 NOTE — Telephone Encounter (Signed)
The patient called to check to see where you were with the prior authorization for her Ritalin.

## 2013-04-20 NOTE — Telephone Encounter (Signed)
I have printed out a prior authorization for patient Nicole Camacho placed in red folder.

## 2013-04-21 NOTE — Telephone Encounter (Signed)
Notified patient prior authorization has been faxed but awaiting approval from insurance.

## 2013-05-01 ENCOUNTER — Ambulatory Visit: Payer: BC Managed Care – PPO | Admitting: Internal Medicine

## 2013-05-01 ENCOUNTER — Telehealth: Payer: Self-pay | Admitting: Internal Medicine

## 2013-05-01 MED ORDER — TRAMADOL HCL 50 MG PO TABS: 50.0000 mg | ORAL_TABLET | Freq: Four times a day (QID) | ORAL | Status: DC | PRN

## 2013-05-01 MED ORDER — CYCLOBENZAPRINE HCL 10 MG PO TABS: 10.0000 mg | ORAL_TABLET | Freq: Three times a day (TID) | ORAL | Status: DC | PRN

## 2013-05-01 MED ORDER — DICLOFENAC SODIUM 75 MG PO TBEC: 75.0000 mg | DELAYED_RELEASE_TABLET | Freq: Two times a day (BID) | ORAL | Status: DC

## 2013-05-01 NOTE — Telephone Encounter (Signed)
Tramadol , NSAID and muscle relaxer sent to [facility] pharmacy.  Nothing else can be called in

## 2013-05-01 NOTE — Telephone Encounter (Signed)
Patient notified. Patient canceled appointment due to she could not arrive at set time.

## 2013-05-01 NOTE — Telephone Encounter (Signed)
Pt pulled her back out and is hurting. She is wanting to know if something could be called in for the pain. I told her she was probably going to have to be seen but she wanted me to ask first. She has appt today with Cherre Huger her chiropractor.

## 2013-05-01 NOTE — Telephone Encounter (Signed)
Patient stated she is seeing chiropractor at 4.15 but would like to know if you will call something in for pain I scheduled patient for an acute visit at 3.15 please advise if you do not need to see patient to treat.I advised patient she should be seen,

## 2013-05-08 ENCOUNTER — Telehealth: Payer: Self-pay | Admitting: Internal Medicine

## 2013-05-08 NOTE — Telephone Encounter (Signed)
Re faxed today with diagnosis code

## 2013-05-08 NOTE — Telephone Encounter (Signed)
The insurance company did not receive the prior authorization. The patient called still waiting on her medication.

## 2013-05-09 ENCOUNTER — Encounter: Payer: Self-pay | Admitting: Internal Medicine

## 2013-05-15 ENCOUNTER — Encounter: Payer: Self-pay | Admitting: Internal Medicine

## 2013-06-16 ENCOUNTER — Ambulatory Visit (INDEPENDENT_AMBULATORY_CARE_PROVIDER_SITE_OTHER): Payer: BC Managed Care – PPO | Admitting: Internal Medicine

## 2013-06-16 ENCOUNTER — Ambulatory Visit (INDEPENDENT_AMBULATORY_CARE_PROVIDER_SITE_OTHER)
Admission: RE | Admit: 2013-06-16 | Discharge: 2013-06-16 | Disposition: A | Discharge status: Home / Self Care | Payer: BC Managed Care – PPO | Source: Ambulatory Visit | Attending: Internal Medicine | Admitting: Internal Medicine

## 2013-06-16 VITALS — BP 110/78 | HR 68 | Temp 98.6°F | Resp 12 | Wt 125.0 lb

## 2013-06-16 DIAGNOSIS — M79671 Pain in right foot: Secondary | ICD-10-CM

## 2013-06-16 DIAGNOSIS — M79609 Pain in unspecified limb: Secondary | ICD-10-CM

## 2013-06-16 DIAGNOSIS — M79676 Pain in unspecified toe(s): Secondary | ICD-10-CM

## 2013-06-16 DIAGNOSIS — F988 Other specified behavioral and emotional disorders with onset usually occurring in childhood and adolescence: Secondary | ICD-10-CM

## 2013-06-16 MED ORDER — METHYLPHENIDATE HCL ER 10 MG PO TBCR: 10.0000 mg | EXTENDED_RELEASE_TABLET | ORAL | Status: DC

## 2013-06-16 NOTE — Progress Notes (Signed)
Patient ID: Nicole Camacho, female   DOB: 09/02/1970, 42 y.o.   MRN: [id number]   

## 2013-06-16 NOTE — Assessment & Plan Note (Signed)
Prior trials of generic

## 2013-06-18 ENCOUNTER — Encounter: Payer: Self-pay | Admitting: Internal Medicine

## 2013-06-18 DIAGNOSIS — M79676 Pain in unspecified toe(s): Secondary | ICD-10-CM | POA: Insufficient documentation

## 2013-06-18 NOTE — Assessment & Plan Note (Signed)
No signs of gout on exam. Soft tissue swelling and arthritis of the joint noted on plain films. Patient will return for steroid injection.

## 2013-06-19 ENCOUNTER — Encounter: Payer: Self-pay | Admitting: Internal Medicine

## 2013-06-19 ENCOUNTER — Ambulatory Visit (INDEPENDENT_AMBULATORY_CARE_PROVIDER_SITE_OTHER): Payer: BC Managed Care – PPO | Admitting: Internal Medicine

## 2013-06-19 VITALS — BP 100/68 | HR 77 | Temp 97.9°F | Resp 16 | Wt 127.5 lb

## 2013-06-19 DIAGNOSIS — M79676 Pain in unspecified toe(s): Secondary | ICD-10-CM

## 2013-06-19 DIAGNOSIS — M79609 Pain in unspecified limb: Secondary | ICD-10-CM

## 2013-06-20 NOTE — Addendum Note (Signed)
Addended by: Crecencio Mc on: 06/20/2013 01:00 PM   Modules accepted: Level of Service

## 2013-06-20 NOTE — Progress Notes (Signed)
Patient ID: Nicole Camacho, female   DOB: 11/18/1970, 43 y.o.   MRN: [id number]

## 2013-06-20 NOTE — Assessment & Plan Note (Addendum)
No evidence of gout or destruction of joitn by plain films t. Small cyst presumed to be ganglionic was injected today with 10 mg of Kenalog and 1 mg of lidocaine 2% after attempts to aspirate were unsuccessful. Patient tolerated the procedure well. If she does not have lasting relief will refer to podiatry.

## 2013-09-23 ENCOUNTER — Other Ambulatory Visit: Payer: Self-pay | Admitting: Internal Medicine

## 2013-09-25 ENCOUNTER — Other Ambulatory Visit: Payer: Self-pay | Admitting: *Deleted

## 2013-09-25 NOTE — Telephone Encounter (Signed)
Last visit 06/19/13, ok refill?

## 2013-10-02 ENCOUNTER — Encounter: Payer: Self-pay | Admitting: Internal Medicine

## 2013-10-02 ENCOUNTER — Ambulatory Visit (INDEPENDENT_AMBULATORY_CARE_PROVIDER_SITE_OTHER): Payer: BC Managed Care – PPO | Admitting: Internal Medicine

## 2013-10-02 VITALS — BP 110/68 | HR 95 | Temp 97.9°F | Resp 18 | Wt 127.2 lb

## 2013-10-02 DIAGNOSIS — R1031 Right lower quadrant pain: Secondary | ICD-10-CM

## 2013-10-02 DIAGNOSIS — K529 Noninfective gastroenteritis and colitis, unspecified: Secondary | ICD-10-CM

## 2013-10-02 DIAGNOSIS — K5289 Other specified noninfective gastroenteritis and colitis: Secondary | ICD-10-CM

## 2013-10-02 DIAGNOSIS — R300 Dysuria: Secondary | ICD-10-CM

## 2013-10-02 LAB — POCT URINALYSIS DIPSTICK
Bilirubin, UA: NEGATIVE
Glucose, UA: NEGATIVE
Ketones, UA: NEGATIVE
Leukocytes, UA: NEGATIVE
Nitrite, UA: NEGATIVE
PH UA: 6
PROTEIN UA: NEGATIVE
SPEC GRAV UA: 1.015
Urobilinogen, UA: 0.2
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
## 2013-10-02 NOTE — Patient Instructions (Signed)
I am ordering a CT of the abdomen and pelvis with contrast to rule out appendicitis,  Diverticulitis and ovarian cyst.

## 2013-10-02 NOTE — Progress Notes (Signed)
Pre-visit discussion using our clinic review tool. No additional management support is needed unless otherwise documented below in the visit note.  

## 2013-10-03 ENCOUNTER — Other Ambulatory Visit (INDEPENDENT_AMBULATORY_CARE_PROVIDER_SITE_OTHER): Payer: BC Managed Care – PPO

## 2013-10-03 ENCOUNTER — Telehealth: Payer: Self-pay | Admitting: *Deleted

## 2013-10-03 ENCOUNTER — Encounter: Payer: Self-pay | Admitting: Internal Medicine

## 2013-10-03 DIAGNOSIS — R1031 Right lower quadrant pain: Secondary | ICD-10-CM | POA: Insufficient documentation

## 2013-10-03 DIAGNOSIS — R300 Dysuria: Secondary | ICD-10-CM

## 2013-10-03 DIAGNOSIS — K5289 Other specified noninfective gastroenteritis and colitis: Secondary | ICD-10-CM

## 2013-10-03 NOTE — Telephone Encounter (Signed)
Patient came into Lab this morning and brought a picture on cell phone of a small dark  Speck a little larger than a pepper flake she saw in [place], patient stated she did not have as much pressure.

## 2013-10-03 NOTE — Telephone Encounter (Signed)
I do not know of an ultrasound that can rule out appendicitis,  Kidney stone and ovarian cyst.   But if they will not do a CT we will start with transvaginal ultrasound to rule out ovarian cyst.

## 2013-10-03 NOTE — Telephone Encounter (Signed)
Left detailed message for patient per DPR °

## 2013-10-03 NOTE — Addendum Note (Signed)
Addended by: Karlene Einstein D on: 10/03/2013 08:20 AM   Modules accepted: Orders

## 2013-10-03 NOTE — Telephone Encounter (Signed)
Yes patent returned at 8 am for labs, that was when she told me she had a black spot in the strainer and showed me picture see first note sent. Size a little larger than a pepper flake and dark in color.

## 2013-10-03 NOTE — Telephone Encounter (Signed)
Looks as though we are starting with ultra sound to rule out Ovarian Cyst. Patient notified of insurance denying CT and change to Ultra sound but patient does require contact of time and date patient at work [facility].

## 2013-10-03 NOTE — Telephone Encounter (Signed)
Ok, i don't see that she returned for labs either.

## 2013-10-03 NOTE — Assessment & Plan Note (Signed)
Etiology may be kidney stone, ovarian cyst, colitis, appendicigtis .  CBC and CMEt ordered.  UA showed trace blood no leukocytes.  CT abd and pelvis with contrast ordered. Urine strainer given.

## 2013-10-03 NOTE — Telephone Encounter (Signed)
Nicole Camacho wanted to let you know insurance is pushing for an ultra sound first will not approve MRI unless specific to rule out one certain thing will not do multiple?

## 2013-10-03 NOTE — Telephone Encounter (Signed)
SOUNDS LIKE WE HAVE THE ANSWER,

## 2013-10-03 NOTE — Telephone Encounter (Signed)
Notified patient that insurance would only approve Ultra sound patient stated she would like to Hold off if the ultra sound only rule out ovarian cyst.

## 2013-10-04 ENCOUNTER — Encounter: Payer: Self-pay | Admitting: Internal Medicine

## 2013-10-04 LAB — CBC WITH DIFFERENTIAL/PLATELET
BASOS ABS: 0 10*3/uL (ref 0.0–0.2)
Basos: 0 %
Eos: 3 %
Eosinophils Absolute: 0.2 10*3/uL (ref 0.0–0.4)
HCT: 39 % (ref 34.0–46.6)
Hemoglobin: 13.4 g/dL (ref 11.1–15.9)
IMMATURE GRANULOCYTES: 0 %
Immature Grans (Abs): 0 10*3/uL (ref 0.0–0.1)
LYMPHS ABS: 1.6 10*3/uL (ref 0.7–3.1)
Lymphs: 23 %
MCH: 30.2 pg (ref 26.6–33.0)
MCHC: 34.4 g/dL (ref 31.5–35.7)
MCV: 88 fL (ref 79–97)
MONOCYTES: 9 %
Monocytes Absolute: 0.6 10*3/uL (ref 0.1–0.9)
Neutrophils Absolute: 4.5 10*3/uL (ref 1.4–7.0)
Neutrophils Relative %: 65 %
RBC: 4.43 x10E6/uL (ref 3.77–5.28)
RDW: 13.2 % (ref 12.3–15.4)
WBC: 6.9 10*3/uL (ref 3.4–10.8)

## 2013-10-04 LAB — COMPREHENSIVE METABOLIC PANEL
A/G RATIO: 2 (ref 1.1–2.5)
ALK PHOS: 29 IU/L — AB (ref 39–117)
ALT: 11 IU/L (ref 0–32)
AST: 17 IU/L (ref 0–40)
Albumin: 4.4 g/dL (ref 3.5–5.5)
BILIRUBIN TOTAL: 0.5 mg/dL (ref 0.0–1.2)
BUN / CREAT RATIO: 18 (ref 9–23)
BUN: 13 mg/dL (ref 6–24)
CO2: 23 mmol/L (ref 18–29)
Calcium: 9.4 mg/dL (ref 8.7–10.2)
Chloride: 102 mmol/L (ref 97–108)
Creatinine, Ser: 0.71 mg/dL (ref 0.57–1.00)
GFR, EST AFRICAN AMERICAN: 121 mL/min/{1.73_m2} (ref >59)
GFR, EST NON AFRICAN AMERICAN: 105 mL/min/{1.73_m2} (ref >59)
Globulin, Total: 2.2 g/dL (ref 1.5–4.5)
Glucose: 84 mg/dL (ref 65–99)
POTASSIUM: 4.3 mmol/L (ref 3.5–5.2)
Sodium: 142 mmol/L (ref 134–144)
Total Protein: 6.6 g/dL (ref 6.0–8.5)

## 2013-10-08 ENCOUNTER — Encounter: Payer: Self-pay | Admitting: Internal Medicine

## 2013-10-08 LAB — TEST CODE CHANGE

## 2013-10-08 LAB — STOOL CULTURE: E coli, Shiga toxin Assay: NEGATIVE

## 2013-10-08 LAB — CLOSTRIDIUM DIFFICILE EIA: C difficile Toxins A+B, EIA: NEGATIVE

## 2013-10-10 MED ORDER — SULFAMETHOXAZOLE-TRIMETHOPRIM 800-160 MG PO TABS: 1.0000 | ORAL_TABLET | Freq: Two times a day (BID) | ORAL | Status: DC

## 2013-10-11 ENCOUNTER — Telehealth: Payer: Self-pay | Admitting: Internal Medicine

## 2013-10-11 NOTE — Telephone Encounter (Signed)
Patient had  A UTI and now has vomiting and diarrhea the vomiting has stopped but is still having Diarrhea. Patient feels the ABX causing nausea, patient still having pain in back and feels weak and drained today, patient is taking a probiotic with it.

## 2013-10-11 NOTE — Telephone Encounter (Signed)
If the diarrhea is watery,  She needs to be tested for C dificile  Colitis,  If it is not watery,  She can take immodium

## 2013-10-11 NOTE — Telephone Encounter (Signed)
Patient Information:  Caller Name: G[place]enda  Phone: [phone]  Patient: Nicole Camacho, Nicole Camacho  Gender: Female  DOB: [birth year]  Age: 43 Years  PCP: Deborra Medina (Adults only)  Pregnant: No  Office Follow Up:  Does the office need to follow up with this patient?: Yes  Instructions For The Office: Patient reports vomiting multiple episodes and diarrhea since starting Bactrim DS.  Guideline supports ED for 6 or more episodes vomiting.  Patient reports she is able to keep down unsweet tea, and has moist mucus membranes.  Last voiding  estimated 13:00 .  Please follow up with patient.  She asks if there should be further tests to see if she has kidney stones or gall stones.  She reports she has passed tiny specks "like pepper" in the urine strainer.   Symptoms  Reason For Call & Symptoms: Patient calling; reports history of being treated for possible kidney stone.  She is being treated for UTI with Bactrim DS.  Took antibiotic  10/10/13 and was up all night vomiting (6 + episodes) and diarrhea (6 times).  She is taking Ibuprfen for fevers and pain with relief of pain/ fever.  Mucus membranes moist; able to keep liquids down (unsweet tea).  Emergent symptoms ruled out. Go to ED Now per Vomiting guideline due to Severe vomiting (6 or more times per day).  .  Reviewed Health History In EMR: Yes  Reviewed Medications In EMR: Yes  Reviewed Allergies In EMR: Yes  Reviewed Surgeries / Procedures: Yes  Date of Onset of Symptoms: 10/02/2013  Treatments Tried: Ibuprofen for fever and pain with relief  Treatments Tried Worked: Yes OB / GYN:  LMP: Unknown  Guideline(s) Used:  Vomiting  Disposition Per Guideline:   Go to ED Now  Reason For Disposition Reached:   Severe vomiting (e.g., 6 or more times/day)  Advice Given:  N/A  RN Overrode Recommendation:  Document Patient  Note to  office for final dispositon.

## 2013-10-12 NOTE — Telephone Encounter (Signed)
Left message for patient to return call to office. 

## 2013-10-17 NOTE — Telephone Encounter (Signed)
Patient stated that she is feeeling much better and that if new or old symptoms re-occur she will call office.

## 2013-10-17 NOTE — Telephone Encounter (Signed)
Left message to return call to office if problems persisting .

## 2013-10-28 ENCOUNTER — Other Ambulatory Visit: Payer: Self-pay | Admitting: Internal Medicine

## 2013-10-30 NOTE — Telephone Encounter (Signed)
Ok to refill,  printed rx  

## 2013-10-30 NOTE — Telephone Encounter (Signed)
Okay to refill? Last seen on 10/02/13

## 2013-10-31 NOTE — Telephone Encounter (Signed)
Rx faxed to pharmacy  

## 2013-11-01 ENCOUNTER — Encounter: Payer: Self-pay | Admitting: Internal Medicine

## 2013-12-22 ENCOUNTER — Telehealth: Payer: Self-pay | Admitting: Internal Medicine

## 2013-12-22 MED ORDER — METHYLPHENIDATE HCL ER 10 MG PO TBCR: 10.0000 mg | EXTENDED_RELEASE_TABLET | ORAL | Status: DC

## 2013-12-22 NOTE — Telephone Encounter (Signed)
Pt called in and stated she needed a 3 month written out prescription of methylphenidate 10 mg. States she would like to pick it up on Monday afternoon and always has the prescription written out to be picked up.

## 2013-12-22 NOTE — Telephone Encounter (Signed)
Refills printed for signature on Monday

## 2013-12-22 NOTE — Telephone Encounter (Signed)
Not needed til Monday.

## 2013-12-25 NOTE — Telephone Encounter (Signed)
Placed at front desk

## 2014-01-29 ENCOUNTER — Ambulatory Visit (INDEPENDENT_AMBULATORY_CARE_PROVIDER_SITE_OTHER): Payer: BC Managed Care – PPO | Admitting: Internal Medicine

## 2014-01-29 ENCOUNTER — Encounter: Payer: Self-pay | Admitting: Internal Medicine

## 2014-01-29 VITALS — BP 104/72 | HR 82 | Temp 97.5°F | Resp 16 | Ht 63.0 in | Wt 123.5 lb

## 2014-01-29 DIAGNOSIS — R51 Headache: Secondary | ICD-10-CM

## 2014-01-29 DIAGNOSIS — Z9079 Acquired absence of other genital organ(s): Secondary | ICD-10-CM

## 2014-01-29 DIAGNOSIS — R519 Headache, unspecified: Secondary | ICD-10-CM

## 2014-01-29 DIAGNOSIS — M79675 Pain in left toe(s): Secondary | ICD-10-CM

## 2014-01-29 DIAGNOSIS — M79609 Pain in unspecified limb: Secondary | ICD-10-CM

## 2014-01-29 DIAGNOSIS — Z90721 Acquired absence of ovaries, unilateral: Principal | ICD-10-CM

## 2014-01-29 DIAGNOSIS — Z90710 Acquired absence of both cervix and uterus: Secondary | ICD-10-CM

## 2014-01-29 DIAGNOSIS — F988 Other specified behavioral and emotional disorders with onset usually occurring in childhood and adolescence: Secondary | ICD-10-CM

## 2014-01-29 DIAGNOSIS — Z0000 Encounter for general adult medical examination without abnormal findings: Secondary | ICD-10-CM

## 2014-01-29 MED ORDER — AMITRIPTYLINE HCL 25 MG PO TABS: 25.0000 mg | ORAL_TABLET | Freq: Every day | ORAL | Status: DC

## 2014-01-29 MED ORDER — METHYLPHENIDATE HCL ER 10 MG PO TBCR: 10.0000 mg | EXTENDED_RELEASE_TABLET | ORAL | Status: DC

## 2014-01-29 NOTE — Progress Notes (Signed)
Patient ID: Nicole Camacho, female   DOB: 1970/08/01, 43 y.o.   MRN: [id number]   Annual exam    Still having 6 to 12 migraines per month  Subjective:     Nicole Camacho is a 44 y.o. female and is here for a comprehensive physical exam. The patient reports no problems.  History   Social History  . Marital Status: Married    Spouse Name: N/A    Number of Children: N/A  . Years of Education: N/A   Occupational History  . Not on file.   Social History Main Topics  . Smoking status: Never Smoker   . Smokeless tobacco: Not on file  . Alcohol Use: Yes     Comment: ocassional exacerbates headaches  . Drug Use: Not on file  . Sexual Activity: Yes   Other Topics Concern  . Not on file   Social History Narrative  . No narrative on file   Health Maintenance  Topic Date Due  . Pap Smear  03/22/2012  . Influenza Vaccine  12/30/2013  . Tetanus/tdap  01/24/2019    The following portions of the patient's history were reviewed and updated as appropriate: allergies, current medications, past family history, past medical history, past social history, past surgical history and problem list.  Review of Systems A comprehensive review of systems was negative.   Objective:   BP 104/72  Pulse 82  Temp(Src) 97.5 F (36.4 C) (Oral)  Resp 16  Ht _0  (1.6 m)  Wt 123 lb 8 oz (56.019 kg)  BMI 21.88 kg/m2  SpO2 99%  General appearance: alert, cooperative and appears stated age Head: Normocephalic, without obvious abnormality, atraumatic Eyes: conjunctivae/corneas clear. PERRL, EOM's intact. Fundi benign. Ears: normal TM's and external ear canals both ears Nose: Nares normal. Septum midline. Mucosa normal. No drainage or sinus tenderness. Throat: lips, mucosa, and tongue normal; teeth and gums normal Neck: no adenopathy, no carotid bruit, no JVD, supple, symmetrical, trachea midline and thyroid not enlarged, symmetric, no tenderness/mass/nodules Lungs: clear to auscultation  bilaterally Breasts: patient has saline implants which are normal appearance, no masses or tenderness Heart: regular rate and rhythm, S1, S2 normal, no murmur, click, rub or gallop Abdomen: soft, non-tender; bowel sounds normal; no masses,  no organomegaly Extremities: extremities normal, atraumatic, no cyanosis or edema Pulses: 2+ and symmetric Skin: Skin color, texture, turgor normal. No rashes or lesions Neurologic: Alert and oriented X 3, normal strength and tone. Normal symmetric reflexes. Normal coordination and gait.     Assessment and Plan:   Attention deficit disorder of adult Managed well with current medications. There has been no escalation in use. 3 months of refills given.      Annual physical exam Annual comprehensive exam was done including breast, excluding pelvic and PAP smear. All screenings have been addressed .   Pain of great toe Resolved after injection of cyst with kenalog and lidocaine  Headache disorder Discussed adding low dose elavil at bedtime to decrease recurrent of headaches occurring at least ten times per month.    Updated Medication List Outpatient Encounter Prescriptions as of 01/29/2014  Medication Sig  . ALPRAZolam (XANAX) 0.25 MG tablet TAKE ONE TABLET BY MOUTH AT BEDTIME ONLY AS NEEDED FOR SLEEP   . butalbital-acetaminophen-caffeine (FIORICET, ESGIC) 50-325-40 MG per tablet Take one to two tablets by mouth once daily as needed for headache  . cyclobenzaprine (FLEXERIL) 10 MG tablet Take 1 tablet (10 mg total) by mouth 3 (three) times daily  as needed for muscle spasms.  . diclofenac (VOLTAREN) 75 MG EC tablet Take 1 tablet (75 mg total) by mouth 2 (two) times daily.  . methylphenidate 10 MG ER tablet Take 1 tablet (10 mg total) by mouth every morning.  . methylphenidate 10 MG ER tablet Take 1 tablet (10 mg total) by mouth every morning.  . methylphenidate 10 MG ER tablet Take 1 tablet (10 mg total) by mouth every morning.  . Multiple Vitamin  (MULTIVITAMIN) tablet Take 1 tablet by mouth daily.  . traMADol (ULTRAM) 50 MG tablet Take 1 tablet (50 mg total) by mouth every 6 (six) hours as needed.  . [DISCONTINUED] methylphenidate 10 MG ER tablet Take 1 tablet (10 mg total) by mouth every morning.  Marland Kitchen amitriptyline (ELAVIL) 25 MG tablet Take 1 tablet (25 mg total) by mouth at bedtime.  . cyanocobalamin 1000 MCG tablet Take 100 mcg by mouth daily.  . NON FORMULARY Take 2 drops by mouth daily. ES8 & ESR Supplement.  . [DISCONTINUED] sulfamethoxazole-trimethoprim (SEPTRA DS) 800-160 MG per tablet Take 1 tablet by mouth 2 (two) times daily.

## 2014-01-29 NOTE — Patient Instructions (Signed)
I am suggesting a trial of amitriptyline daily in the evening as a preventive headache medication  You may want to try adding a muscle relaxer to your fioricet the next time you have a tension headache  Fasting labs  Letter  Provided for you to get them done at [facility]

## 2014-01-29 NOTE — Progress Notes (Signed)
Pre-visit discussion using our clinic review tool. No additional management support is needed unless otherwise documented below in the visit note.  

## 2014-01-31 ENCOUNTER — Encounter: Payer: Self-pay | Admitting: Internal Medicine

## 2014-01-31 NOTE — Assessment & Plan Note (Signed)
Annual comprehensive exam was done including breast, excluding pelvic and PAP smear. All screenings have been addressed .  

## 2014-01-31 NOTE — Assessment & Plan Note (Signed)
Managed well with current medications. There has been no escalation in use. 3 months of refills given.

## 2014-01-31 NOTE — Assessment & Plan Note (Signed)
Resolved after injection of cyst with kenalog and lidocaine

## 2014-01-31 NOTE — Assessment & Plan Note (Addendum)
Discussed adding low dose elavil at bedtime to decrease recurrent of headaches occurring at least ten times per month.

## 2014-05-23 ENCOUNTER — Telehealth: Payer: Self-pay

## 2014-05-23 MED ORDER — METHYLPHENIDATE HCL ER 10 MG PO TBCR: 10.0000 mg | EXTENDED_RELEASE_TABLET | ORAL | Status: DC

## 2014-05-23 NOTE — Telephone Encounter (Signed)
Last visit 01/29/14

## 2014-05-23 NOTE — Telephone Encounter (Signed)
Nicole Camacho called and left message for patient, Rx ready for pickup.

## 2014-05-23 NOTE — Telephone Encounter (Signed)
Ok to refill,  printed rx x 3. Please schedule OV in March

## 2014-05-23 NOTE — Telephone Encounter (Signed)
The patient called and is hoping to get three months worth of her methylphenidate 10mg  printed out.   Pt callback - [phone]

## 2014-05-30 ENCOUNTER — Telehealth: Payer: Self-pay

## 2014-05-30 NOTE — Telephone Encounter (Signed)
Prior Authorization was approved for Methylphenidate HCL ER from 05/09/14 - 05/30/15.

## 2014-05-30 NOTE — Telephone Encounter (Signed)
Error

## 2014-05-30 NOTE — Telephone Encounter (Signed)
Prior Authorization form has been submitted through cover my meds for methylphenidate HCL ER. Awaiting decision at this time.

## 2014-06-26 ENCOUNTER — Telehealth: Payer: Self-pay

## 2014-06-26 NOTE — Telephone Encounter (Signed)
Prior authorization renewal for methylphenidate HCL ER 10mg  has been completed online through cover my meds. Awaiting response from insurance at this time.

## 2014-07-05 NOTE — Telephone Encounter (Signed)
Prior Authorization has been approved for methlphenidate HCL ER 10mg  effective 06/14/14 through 07/05/15. Pharmacy notified.

## 2014-11-28 ENCOUNTER — Telehealth: Payer: Self-pay | Admitting: Internal Medicine

## 2014-11-28 NOTE — Telephone Encounter (Signed)
methylphenidate 10 MG ER tablet

## 2014-11-29 MED ORDER — METHYLPHENIDATE HCL ER 10 MG PO TBCR: 10.0000 mg | EXTENDED_RELEASE_TABLET | ORAL | Status: DC

## 2014-11-29 NOTE — Telephone Encounter (Signed)
Pt aware Rx ready for pick up 

## 2014-11-29 NOTE — Telephone Encounter (Signed)
3 months of refills authorized   Message sent to patient on need for 6 month follow up going forward

## 2014-11-29 NOTE — Telephone Encounter (Signed)
Spoke with pt to schedule appoint last OV 8.31.15.  Pt declined appoint states she is seen once a year for her CPE and Methylphenidate is refilled every 3 months.  Next appoint scheduled 9.12.16Please advise refill

## 2015-01-18 LAB — HM MAMMOGRAPHY

## 2015-01-21 ENCOUNTER — Telehealth: Payer: Self-pay | Admitting: Internal Medicine

## 2015-01-21 NOTE — Telephone Encounter (Signed)
abstraction

## 2015-02-11 ENCOUNTER — Ambulatory Visit (INDEPENDENT_AMBULATORY_CARE_PROVIDER_SITE_OTHER): Payer: BC Managed Care – PPO | Admitting: Internal Medicine

## 2015-02-11 ENCOUNTER — Encounter: Payer: Self-pay | Admitting: Internal Medicine

## 2015-02-11 VITALS — BP 112/74 | HR 71 | Temp 97.9°F | Resp 12 | Ht 63.0 in | Wt 127.4 lb

## 2015-02-11 DIAGNOSIS — Z0000 Encounter for general adult medical examination without abnormal findings: Secondary | ICD-10-CM

## 2015-02-11 DIAGNOSIS — F988 Other specified behavioral and emotional disorders with onset usually occurring in childhood and adolescence: Secondary | ICD-10-CM

## 2015-02-11 DIAGNOSIS — E559 Vitamin D deficiency, unspecified: Secondary | ICD-10-CM

## 2015-02-11 MED ORDER — METHYLPHENIDATE HCL ER 10 MG PO TBCR: 10.0000 mg | EXTENDED_RELEASE_TABLET | ORAL | Status: DC

## 2015-02-11 NOTE — Patient Instructions (Signed)
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
## 2015-02-11 NOTE — Progress Notes (Signed)
Pre visit review using our clinic review tool, if applicable. No additional management support is needed unless otherwise documented below in the visit note. 

## 2015-02-13 NOTE — Assessment & Plan Note (Signed)

## 2015-02-13 NOTE — Assessment & Plan Note (Signed)
Managed well with current medications. There has been no escalation in use. 3 months of refills given.

## 2015-07-10 ENCOUNTER — Other Ambulatory Visit: Payer: Self-pay | Admitting: *Deleted

## 2015-07-10 NOTE — Telephone Encounter (Signed)
Pt is requesting Methylphenidate 10mg  ER. Pt last OV 02/11/15, last filled 02/11/15 #30 tabs, with 0 refills. Please advise thanks

## 2015-07-10 NOTE — Telephone Encounter (Signed)
Patient requested a medication prior authorization  for methylphenidate 10 MG ER tablet  90 day supply [facility] [employer]  She also need a copy of lab work that Nicole Camacho may need. She will have her labs drawn by the city of [place]

## 2015-07-11 ENCOUNTER — Telehealth: Payer: Self-pay

## 2015-07-11 MED ORDER — METHYLPHENIDATE HCL ER 10 MG PO TBCR: 10.0000 mg | EXTENDED_RELEASE_TABLET | ORAL | Status: DC

## 2015-07-11 NOTE — Telephone Encounter (Signed)
Refill for 30 days only.  OFFICE VISIT NEEDED prior to any more refills 

## 2015-07-11 NOTE — Telephone Encounter (Signed)
Prescription at front for pick up.

## 2015-07-11 NOTE — Telephone Encounter (Signed)
PA Aprroved

## 2015-07-11 NOTE — Telephone Encounter (Signed)
PA for Methylphenidate ER on cover my meds.

## 2015-07-12 NOTE — Telephone Encounter (Signed)
Approved from 06/11/2015-07/10/2018.

## 2015-07-15 ENCOUNTER — Telehealth: Payer: Self-pay | Admitting: *Deleted

## 2015-10-03 ENCOUNTER — Telehealth: Payer: Self-pay | Admitting: Internal Medicine

## 2015-10-03 NOTE — Telephone Encounter (Signed)
This patient needs a f/u appt as she was told with the last refill that it would not be refilled without f/u.  Please schedule. Thanks

## 2015-10-03 NOTE — Telephone Encounter (Signed)
atient Name: Nicole Camacho  DOB: 1970/10/31    Initial Comment caller states she needs an rx refill    Nurse Assessment  Nurse: Raphael Gibney, RN, Vanita Ingles Date/Time Eilene Ghazi Time): 10/03/2015 10:20:04 AM  Please select the assessment type ---Refill  Additional Documentation ---Caller states she needs a refill of her methylphenidate ER 10 mg. She has 1 pill left.  Does the patient have enough medication to last until the office opens? ---Yes  Additional Documentation ---Please call pt back and let her know about medication refill.     Guidelines    Guideline Title Affirmed Question Affirmed Notes       Final Disposition User   Clinical Call [place], RN, Vanita Ingles

## 2016-02-07 ENCOUNTER — Encounter: Payer: Self-pay | Admitting: Internal Medicine

## 2016-02-14 ENCOUNTER — Encounter: Payer: BC Managed Care – PPO | Admitting: Internal Medicine

## 2016-02-18 ENCOUNTER — Other Ambulatory Visit: Payer: Self-pay | Admitting: Internal Medicine

## 2016-02-18 ENCOUNTER — Encounter: Payer: Self-pay | Admitting: Internal Medicine

## 2016-02-18 ENCOUNTER — Ambulatory Visit (INDEPENDENT_AMBULATORY_CARE_PROVIDER_SITE_OTHER): Payer: BC Managed Care – PPO | Admitting: Internal Medicine

## 2016-02-18 VITALS — BP 102/64 | HR 74 | Temp 98.0°F | Ht 62.5 in | Wt 125.2 lb

## 2016-02-18 DIAGNOSIS — R5383 Other fatigue: Secondary | ICD-10-CM

## 2016-02-18 DIAGNOSIS — Z90710 Acquired absence of both cervix and uterus: Secondary | ICD-10-CM | POA: Diagnosis not present

## 2016-02-18 DIAGNOSIS — E559 Vitamin D deficiency, unspecified: Secondary | ICD-10-CM | POA: Diagnosis not present

## 2016-02-18 DIAGNOSIS — E785 Hyperlipidemia, unspecified: Secondary | ICD-10-CM

## 2016-02-18 DIAGNOSIS — Z9079 Acquired absence of other genital organ(s): Secondary | ICD-10-CM

## 2016-02-18 DIAGNOSIS — F988 Other specified behavioral and emotional disorders with onset usually occurring in childhood and adolescence: Secondary | ICD-10-CM

## 2016-02-18 DIAGNOSIS — F900 Attention-deficit hyperactivity disorder, predominantly inattentive type: Secondary | ICD-10-CM

## 2016-02-18 DIAGNOSIS — Z90721 Acquired absence of ovaries, unilateral: Secondary | ICD-10-CM

## 2016-02-18 DIAGNOSIS — Z0000 Encounter for general adult medical examination without abnormal findings: Secondary | ICD-10-CM | POA: Diagnosis not present

## 2016-02-18 MED ORDER — METHYLPHENIDATE HCL ER (OSM) 18 MG PO TBCR: 18.0000 mg | EXTENDED_RELEASE_TABLET | ORAL | 0 refills | Status: DC

## 2016-02-18 MED ORDER — METHYLPHENIDATE HCL ER 10 MG PO TBCR: 10.0000 mg | EXTENDED_RELEASE_TABLET | ORAL | 0 refills | Status: DC

## 2016-02-18 NOTE — Assessment & Plan Note (Signed)
No PAP smear required

## 2016-02-18 NOTE — Patient Instructions (Addendum)
I am increasing the dose of methyphenidate to 18 mg per your request.  Menopause is a normal process in which your reproductive ability comes to an end. This process happens gradually over a span of months to years, usually between the ages of 4 and 38. Menopause is complete when you have missed 12 consecutive menstrual periods. It is important to talk with your health care provider about some of the most common conditions that affect postmenopausal women, such as heart disease, cancer, and bone loss (osteoporosis). Adopting a healthy lifestyle and getting preventive care can help to promote your health and wellness. Those actions can also lower your chances of developing some of these common conditions. WHAT SHOULD I KNOW ABOUT MENOPAUSE? During menopause, you may experience a number of symptoms, such as:  Moderate-to-severe hot flashes.  Night sweats.  Decrease in sex drive.  Mood swings.  Headaches.  Tiredness.  Irritability.  Memory problems.  Insomnia. Choosing to treat or not to treat menopausal changes is an individual decision that you make with your health care provider. WHAT SHOULD I KNOW ABOUT HORMONE REPLACEMENT THERAPY AND SUPPLEMENTS? Hormone therapy products are effective for treating symptoms that are associated with menopause, such as hot flashes and night sweats. Hormone replacement carries certain risks, especially as you become older. If you are thinking about using estrogen or estrogen with progestin treatments, discuss the benefits and risks with your health care provider. WHAT SHOULD I KNOW ABOUT HEART DISEASE AND STROKE? Heart disease, heart attack, and stroke become more likely as you age. This may be due, in part, to the hormonal changes that your body experiences during menopause. These can affect how your body processes dietary fats, triglycerides, and cholesterol. Heart attack and stroke are both medical emergencies. There are many things that you can do to  help prevent heart disease and stroke:  Have your blood pressure checked at least every 1-2 years. High blood pressure causes heart disease and increases the risk of stroke.  If you are 70-25 years old, ask your health care provider if you should take aspirin to prevent a heart attack or a stroke.  Do not use any tobacco products, including cigarettes, chewing tobacco, or electronic cigarettes. If you need help quitting, ask your health care provider.  It is important to eat a healthy diet and maintain a healthy weight.  Be sure to include plenty of vegetables, fruits, low-fat dairy products, and lean protein.  Avoid eating foods that are high in solid fats, added sugars, or salt (sodium).  Get regular exercise. This is one of the most important things that you can do for your health.  Try to exercise for at least 150 minutes each week. The type of exercise that you do should increase your heart rate and make you sweat. This is known as moderate-intensity exercise.  Try to do strengthening exercises at least twice each week. Do these in addition to the moderate-intensity exercise.  Know your numbers.Ask your health care provider to check your cholesterol and your blood glucose. Continue to have your blood tested as directed by your health care provider. WHAT SHOULD I KNOW ABOUT CANCER SCREENING? There are several types of cancer. Take the following steps to reduce your risk and to catch any cancer development as early as possible. Breast Cancer  Practice breast self-awareness.  This means understanding how your breasts normally appear and feel.  It also means doing regular breast self-exams. Let your health care provider know about any changes, no matter  how small.  If you are 71 or older, have a clinician do a breast exam (clinical breast exam or CBE) every year. Depending on your age, family history, and medical history, it may be recommended that you also have a yearly breast X-ray  (mammogram).  If you have a family history of breast cancer, talk with your health care provider about genetic screening.  If you are at high risk for breast cancer, talk with your health care provider about having an MRI and a mammogram every year.  Breast cancer (BRCA) gene test is recommended for women who have family members with BRCA-related cancers. Results of the assessment will determine the need for genetic counseling and BRCA1 and for BRCA2 testing. BRCA-related cancers include these types:  Breast. This occurs in males or females.  Ovarian.  Tubal. This may also be called fallopian tube cancer.  Cancer of the abdominal or pelvic lining (peritoneal cancer).  Prostate.  Pancreatic. Cervical, Uterine, and Ovarian Cancer Your health care provider may recommend that you be screened regularly for cancer of the pelvic organs. These include your ovaries, uterus, and vagina. This screening involves a pelvic exam, which includes checking for microscopic changes to the surface of your cervix (Pap test).  For women ages 21-65, health care providers may recommend a pelvic exam and a Pap test every three years. For women ages 25-65, they may recommend the Pap test and pelvic exam, combined with testing for human papilloma virus (HPV), every five years. Some types of HPV increase your risk of cervical cancer. Testing for HPV may also be done on women of any age who have unclear Pap test results.  Other health care providers may not recommend any screening for nonpregnant women who are considered low risk for pelvic cancer and have no symptoms. Ask your health care provider if a screening pelvic exam is right for you.  If you have had past treatment for cervical cancer or a condition that could lead to cancer, you need Pap tests and screening for cancer for at least 20 years after your treatment. If Pap tests have been discontinued for you, your risk factors (such as having a new sexual partner)  need to be reassessed to determine if you should start having screenings again. Some women have medical problems that increase the chance of getting cervical cancer. In these cases, your health care provider may recommend that you have screening and Pap tests more often.  If you have a family history of uterine cancer or ovarian cancer, talk with your health care provider about genetic screening.  If you have vaginal bleeding after reaching menopause, tell your health care provider.  There are currently no reliable tests available to screen for ovarian cancer. Lung Cancer Lung cancer screening is recommended for adults 12-51 years old who are at high risk for lung cancer because of a history of smoking. A yearly low-dose CT scan of the lungs is recommended if you:  Currently smoke.  Have a history of at least 30 pack-years of smoking and you currently smoke or have quit within the past 15 years. A pack-year is smoking an average of one pack of cigarettes per day for one year. Yearly screening should:  Continue until it has been 15 years since you quit.  Stop if you develop a health problem that would prevent you from having lung cancer treatment. Colorectal Cancer  This type of cancer can be detected and can often be prevented.  Routine colorectal cancer screening  usually begins at age 39 and continues through age 55.  If you have risk factors for colon cancer, your health care provider may recommend that you be screened at an earlier age.  If you have a family history of colorectal cancer, talk with your health care provider about genetic screening.  Your health care provider may also recommend using home test kits to check for hidden blood in your stool.  A small camera at the end of a tube can be used to examine your colon directly (sigmoidoscopy or colonoscopy). This is done to check for the earliest forms of colorectal cancer.  Direct examination of the colon should be repeated  every 5-10 years until age 63. However, if early forms of precancerous polyps or small growths are found or if you have a family history or genetic risk for colorectal cancer, you may need to be screened more often. Skin Cancer  Check your skin from head to toe regularly.  Monitor any moles. Be sure to tell your health care provider:  About any new moles or changes in moles, especially if there is a change in a mole's shape or color.  If you have a mole that is larger than the size of a pencil eraser.  If any of your family members has a history of skin cancer, especially at a young age, talk with your health care provider about genetic screening.  Always use sunscreen. Apply sunscreen liberally and repeatedly throughout the day.  Whenever you are outside, protect yourself by wearing long sleeves, pants, a wide-brimmed hat, and sunglasses. WHAT SHOULD I KNOW ABOUT OSTEOPOROSIS? Osteoporosis is a condition in which bone destruction happens more quickly than new bone creation. After menopause, you may be at an increased risk for osteoporosis. To help prevent osteoporosis or the bone fractures that can happen because of osteoporosis, the following is recommended:  If you are 39-35 years old, get at least 1,000 mg of calcium and at least 600 mg of vitamin D per day.  If you are older than age 63 but younger than age 84, get at least 1,200 mg of calcium and at least 600 mg of vitamin D per day.  If you are older than age 51, get at least 1,200 mg of calcium and at least 800 mg of vitamin D per day. Smoking and excessive alcohol intake increase the risk of osteoporosis. Eat foods that are rich in calcium and vitamin D, and do weight-bearing exercises several times each week as directed by your health care provider. WHAT SHOULD I KNOW ABOUT HOW MENOPAUSE AFFECTS [place]? Depression may occur at any age, but it is more common as you become older. Common symptoms of depression  include:  Low or sad mood.  Changes in sleep patterns.  Changes in appetite or eating patterns.  Feeling an overall lack of motivation or enjoyment of activities that you previously enjoyed.  Frequent crying spells. Talk with your health care provider if you think that you are experiencing depression. WHAT SHOULD I KNOW ABOUT IMMUNIZATIONS? It is important that you get and maintain your immunizations. These include:  Tetanus, diphtheria, and pertussis (Tdap) booster vaccine.  Influenza every year before the flu season begins.  Pneumonia vaccine.  Shingles vaccine. Your health care provider may also recommend other immunizations.   This information is not intended to replace advice given to you by your health care provider. Make sure you discuss any questions you have with your health care provider.   Document  Released: 07/10/2005 Document Revised: 06/08/2014 Document Reviewed: 01/18/2014 Elsevier Interactive Patient Education [employer].
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
## 2016-02-19 ENCOUNTER — Encounter: Payer: Self-pay | Admitting: Internal Medicine

## 2016-02-19 LAB — COMPREHENSIVE METABOLIC PANEL
ALT: 12 IU/L (ref 0–32)
AST: 14 IU/L (ref 0–40)
Albumin/Globulin Ratio: 2.2 (ref 1.2–2.2)
Albumin: 5 g/dL (ref 3.5–5.5)
Alkaline Phosphatase: 30 IU/L — ABNORMAL LOW (ref 39–117)
BUN/Creatinine Ratio: 20 (ref 9–23)
BUN: 14 mg/dL (ref 6–24)
Bilirubin Total: 0.4 mg/dL (ref 0.0–1.2)
CALCIUM: 10.1 mg/dL (ref 8.7–10.2)
CO2: 27 mmol/L (ref 18–29)
CREATININE: 0.71 mg/dL (ref 0.57–1.00)
Chloride: 99 mmol/L (ref 96–106)
GFR calc Af Amer: 119 mL/min/{1.73_m2} (ref >59)
GFR, EST NON AFRICAN AMERICAN: 103 mL/min/{1.73_m2} (ref >59)
GLOBULIN, TOTAL: 2.3 g/dL (ref 1.5–4.5)
GLUCOSE: 93 mg/dL (ref 65–99)
Potassium: 4.1 mmol/L (ref 3.5–5.2)
SODIUM: 141 mmol/L (ref 134–144)
Total Protein: 7.3 g/dL (ref 6.0–8.5)

## 2016-02-19 LAB — LIPID PANEL
Chol/HDL Ratio: 2.3 ratio units (ref 0.0–4.4)
Cholesterol, Total: 194 mg/dL (ref 100–199)
HDL: 86 mg/dL (ref >39)
LDL CALC: 94 mg/dL (ref 0–99)
TRIGLYCERIDES: 69 mg/dL (ref 0–149)
VLDL CHOLESTEROL CAL: 14 mg/dL (ref 5–40)

## 2016-02-19 LAB — CBC WITH DIFFERENTIAL/PLATELET
BASOS ABS: 0 10*3/uL (ref 0.0–0.2)
Basos: 0 %
EOS (ABSOLUTE): 0.1 10*3/uL (ref 0.0–0.4)
EOS: 2 %
Hematocrit: 42.5 % (ref 34.0–46.6)
Hemoglobin: 14.3 g/dL (ref 11.1–15.9)
Immature Grans (Abs): 0 10*3/uL (ref 0.0–0.1)
Immature Granulocytes: 0 %
LYMPHS ABS: 1.9 10*3/uL (ref 0.7–3.1)
Lymphs: 29 %
MCH: 29.7 pg (ref 26.6–33.0)
MCHC: 33.6 g/dL (ref 31.5–35.7)
MCV: 88 fL (ref 79–97)
MONOCYTES: 8 %
Monocytes Absolute: 0.5 10*3/uL (ref 0.1–0.9)
Neutrophils Absolute: 4 10*3/uL (ref 1.4–7.0)
Neutrophils: 61 %
Platelets: 240 10*3/uL (ref 150–379)
RBC: 4.82 x10E6/uL (ref 3.77–5.28)
RDW: 13.3 % (ref 12.3–15.4)
WBC: 6.5 10*3/uL (ref 3.4–10.8)

## 2016-02-19 LAB — TSH: TSH: 0.837 u[IU]/mL (ref 0.450–4.500)

## 2016-02-19 LAB — VITAMIN D 25 HYDROXY (VIT D DEFICIENCY, FRACTURES): Vit D, 25-Hydroxy: 33.2 ng/mL (ref 30.0–100.0)

## 2016-02-19 NOTE — Telephone Encounter (Signed)
Patient seen 02/18/16 ok to fill aplrazolam and fiorcet? I cannot print from lab.

## 2016-02-20 NOTE — Assessment & Plan Note (Addendum)
Annual comprehensive preventive exam was done as well as an evaluation and management of chronic conditions .  During the course of the visit the patient was educated and counseled about appropriate screening and preventive services including :  diabetes screening, lipid analysis with projected  10 year  risk for CAD , nutrition counseling, breast, cervical and colorectal cancer screening, and recommended immunizations.  Printed recommendations for health maintenance screenings was given.  Lab Results  Component Value Date   CHOL 194 02/18/2016   HDL 86 02/18/2016   LDLCALC 94 02/18/2016   TRIG 69 02/18/2016   CHOLHDL 2.3 02/18/2016   Lab Results  Component Value Date   TSH 0.837 02/18/2016

## 2016-02-20 NOTE — Assessment & Plan Note (Signed)
Managed well until recently with 10 mg ER dose  There has been no escalation in use.  Dose increased to 18 mg as a trial .

## 2016-04-03 ENCOUNTER — Encounter: Payer: Self-pay | Admitting: Internal Medicine

## 2016-04-04 ENCOUNTER — Other Ambulatory Visit: Payer: Self-pay | Admitting: Internal Medicine

## 2016-04-04 MED ORDER — METHYLPHENIDATE HCL ER (OSM) 18 MG PO TBCR: 18.0000 mg | EXTENDED_RELEASE_TABLET | ORAL | 0 refills | Status: DC

## 2016-05-19 ENCOUNTER — Ambulatory Visit: Payer: BC Managed Care – PPO | Admitting: Internal Medicine

## 2016-05-29 ENCOUNTER — Ambulatory Visit: Payer: BC Managed Care – PPO | Admitting: Internal Medicine

## 2016-06-08 ENCOUNTER — Ambulatory Visit: Payer: BC Managed Care – PPO | Admitting: Internal Medicine

## 2016-06-08 ENCOUNTER — Encounter: Payer: Self-pay | Admitting: Internal Medicine

## 2016-06-08 ENCOUNTER — Other Ambulatory Visit (INDEPENDENT_AMBULATORY_CARE_PROVIDER_SITE_OTHER): Payer: BC Managed Care – PPO

## 2016-06-08 ENCOUNTER — Telehealth: Payer: Self-pay | Admitting: Internal Medicine

## 2016-06-08 DIAGNOSIS — R300 Dysuria: Secondary | ICD-10-CM

## 2016-06-08 LAB — POCT URINALYSIS DIPSTICK
Bilirubin, UA: NEGATIVE
GLUCOSE UA: NEGATIVE
Ketones, UA: NEGATIVE
Leukocytes, UA: NEGATIVE
NITRITE UA: NEGATIVE
PH UA: 6
PROTEIN UA: NEGATIVE
RBC UA: NEGATIVE
Spec Grav, UA: <=1.005
UROBILINOGEN UA: 0.2

## 2016-06-08 NOTE — Telephone Encounter (Signed)
Went to bathroom and had cramping  When urinating, cramping lasted about 2 hours. Patient had relief after urinating. Patient scheduled for lab  And labs entered, FYI

## 2016-06-09 LAB — URINALYSIS, ROUTINE W REFLEX MICROSCOPIC
BILIRUBIN UA: NEGATIVE
Bilirubin, UA: NEGATIVE
Glucose, UA: NEGATIVE
Glucose, UA: NEGATIVE
KETONES UA: NEGATIVE
Ketones, UA: NEGATIVE
Leukocytes, UA: NEGATIVE
Leukocytes, UA: NEGATIVE
NITRITE UA: NEGATIVE
NITRITE UA: NEGATIVE
PH UA: 6.5 (ref 5.0–7.5)
Protein, UA: NEGATIVE
Protein, UA: NEGATIVE
RBC UA: NEGATIVE
RBC, UA: NEGATIVE
SPEC GRAV UA: 1.006 (ref 1.005–1.030)
Specific Gravity, UA: 1.007 (ref 1.005–1.030)
UUROB: 0.2 mg/dL (ref 0.2–1.0)
Urobilinogen, Ur: 0.2 mg/dL (ref 0.2–1.0)
pH, UA: 6.5 (ref 5.0–7.5)

## 2016-06-10 ENCOUNTER — Encounter: Payer: Self-pay | Admitting: Internal Medicine

## 2016-06-10 LAB — URINE CULTURE

## 2016-06-15 ENCOUNTER — Encounter: Payer: Self-pay | Admitting: Internal Medicine

## 2016-06-15 ENCOUNTER — Ambulatory Visit (INDEPENDENT_AMBULATORY_CARE_PROVIDER_SITE_OTHER): Payer: BC Managed Care – PPO | Admitting: Internal Medicine

## 2016-06-15 DIAGNOSIS — R102 Pelvic and perineal pain: Secondary | ICD-10-CM | POA: Diagnosis not present

## 2016-06-15 DIAGNOSIS — F988 Other specified behavioral and emotional disorders with onset usually occurring in childhood and adolescence: Secondary | ICD-10-CM | POA: Diagnosis not present

## 2016-06-15 MED ORDER — METHYLPHENIDATE HCL ER (OSM) 18 MG PO TBCR: 18.0000 mg | EXTENDED_RELEASE_TABLET | ORAL | 0 refills | Status: DC

## 2016-06-15 MED ORDER — HYOSCYAMINE SULFATE 0.125 MG PO TABS: 0.1250 mg | ORAL_TABLET | ORAL | 0 refills | Status: DC | PRN

## 2016-06-15 NOTE — Patient Instructions (Addendum)
Trial of levsin for bladder spasms.  Use as needed   If they recur,  We will order a bladder ultrasound

## 2016-06-15 NOTE — Progress Notes (Signed)
Subjective:  Patient ID: Nicole Camacho, female    DOB: Nov 26, 1970  Age: 46 y.o. MRN: [id number]  CC: Diagnoses of Attention deficit disorder of adult and Suprapubic pain were pertinent to this visit.  HPI [name] presents for evaluation of an episode of suprapubic cramping that occurred during a voiding episode.  The pain became severe and blasted less than two minutes  before resolving spontaneously.  The second episode occurred a week later, but felt like it was over the right ovary,  Did not feel the same.  First episode was followed by a period of bloating that lasted about an hour    Outpatient Medications Prior to Visit  Medication Sig Dispense Refill  . ALPRAZolam (XANAX) 0.25 MG tablet TAKE ONE TABLET BY MOUTH NIGHTLY AT BEDTIME ONLY AS NEEDED FOR SLEEP 30 tablet 2  . butalbital-acetaminophen-caffeine (FIORICET, ESGIC) 50-325-40 MG tablet TAKE 1-2 TABLETS BY MOUTH EVERY DAY AS NEEDED FOR HEADACHE 60 tablet 2  . cyanocobalamin 1000 MCG tablet Take 100 mcg by mouth daily.    . cyclobenzaprine (FLEXERIL) 10 MG tablet Take 1 tablet (10 mg total) by mouth 3 (three) times daily as needed for muscle spasms. 30 tablet 0  . diclofenac (VOLTAREN) 75 MG EC tablet Take 1 tablet (75 mg total) by mouth 2 (two) times daily. 60 tablet 0  . Multiple Vitamin (MULTIVITAMIN) tablet Take 1 tablet by mouth daily.    . traMADol (ULTRAM) 50 MG tablet Take 1 tablet (50 mg total) by mouth every 6 (six) hours as needed. 90 tablet 0  . methylphenidate 18 MG PO CR tablet Take 1 tablet (18 mg total) by mouth every morning. 30 tablet 0   No facility-administered medications prior to visit.     Review of Systems;  Patient denies headache, fevers, malaise, unintentional weight loss, skin rash, eye pain, sinus congestion and sinus pain, sore throat, dysphagia,  hemoptysis , cough, dyspnea, wheezing, chest pain, palpitations, orthopnea, edema, abdominal pain, nausea, melena, diarrhea, constipation, flank  pain, dysuria, hematuria, urinary  Frequency, nocturia, numbness, tingling, seizures,  Focal weakness, Loss of consciousness,  Tremor, insomnia, depression, anxiety, and suicidal ideation.      Objective:  BP 118/70   Pulse 71   Temp 97.7 F (36.5 C) (Oral)   Resp 12   Wt 125 lb 6.4 oz (56.9 kg)   SpO2 98%   BMI 22.57 kg/m   BP Readings from Last 3 Encounters:  06/15/16 118/70  02/18/16 102/64  02/11/15 112/74    Wt Readings from Last 3 Encounters:  06/15/16 125 lb 6.4 oz (56.9 kg)  02/18/16 125 lb 4 oz (56.8 kg)  02/11/15 127 lb 6.4 oz (57.8 kg)    General appearance: alert, cooperative and appears stated age Ears: normal TM's and external ear canals both ears Throat: lips, mucosa, and tongue normal; teeth and gums normal Neck: no adenopathy, no carotid bruit, supple, symmetrical, trachea midline and thyroid not enlarged, symmetric, no tenderness/mass/nodules Back: symmetric, no curvature. ROM normal. No CVA tenderness. Lungs: clear to auscultation bilaterally Heart: regular rate and rhythm, S1, S2 normal, no murmur, click, rub or gallop Abdomen: soft, non-tender; bowel sounds normal; no masses,  no organomegaly Pulses: 2+ and symmetric Skin: Skin color, texture, turgor normal. No rashes or lesions Lymph nodes: Cervical, supraclavicular, and axillary nodes normal.  No results found for: HGBA1C  Lab Results  Component Value Date   CREATININE 0.71 02/18/2016   CREATININE 0.71 10/03/2013    Lab Results  Component Value Date   WBC 6.5 02/18/2016   HGB 13.4 10/03/2013   HCT 42.5 02/18/2016   PLT 240 02/18/2016   GLUCOSE 93 02/18/2016   CHOL 194 02/18/2016   TRIG 69 02/18/2016   HDL 86 02/18/2016   LDLCALC 94 02/18/2016   ALT 12 02/18/2016   AST 14 02/18/2016   NA 141 02/18/2016   K 4.1 02/18/2016   CL 99 02/18/2016   CREATININE 0.71 02/18/2016   BUN 14 02/18/2016   CO2 27 02/18/2016   TSH 0.837 02/18/2016    Dg Foot Complete Right  Result Date:  06/16/2013 CLINICAL DATA:  Pain. EXAM: RIGHT FOOT COMPLETE - 3+ VIEW COMPARISON:  None. FINDINGS: Soft tissue swelling noted adjacent to first MTP joint. Moderate degenerative changes are noted about the first MTP joint. No fracture or dislocation. IMPRESSION: 1. No acute abnormality. 2. Degenerative changes first MTP joint. Electronically Signed   By: Marcello Moores  Register   On: 06/16/2013 14:02    Assessment & Plan:   Problem List Items Addressed This Visit    Attention deficit disorder of adult    Managed well  18 mg daily. Refill history confirmed via [place] Controlled Substance databas, accessed by me today..         Suprapubic pain    Episodic, infrequent .  Trial of levsin,  UTI ruled out.  If recurrent will begin evaluation with ultrasound and post void residual           I am having Ms. Chinn start on hyoscyamine. I am also having her maintain her multivitamin, cyanocobalamin, traMADol, diclofenac, cyclobenzaprine, butalbital-acetaminophen-caffeine, ALPRAZolam, and methylphenidate.  Meds ordered this encounter  Medications  . hyoscyamine (LEVSIN, ANASPAZ) 0.125 MG tablet    Sig: Take 1 tablet (0.125 mg total) by mouth every 4 (four) hours as needed.    Dispense:  30 tablet    Refill:  0  . DISCONTD: methylphenidate 18 MG PO CR tablet    Sig: Take 1 tablet (18 mg total) by mouth every morning.    Dispense:  30 tablet    Refill:  0    May refill on or after Jun 19 2016  . DISCONTD: methylphenidate 18 MG PO CR tablet    Sig: Take 1 tablet (18 mg total) by mouth every morning.    Dispense:  30 tablet    Refill:  0    May refill on or after Jul 20 2016  . methylphenidate 18 MG PO CR tablet    Sig: Take 1 tablet (18 mg total) by mouth every morning.    Dispense:  30 tablet    Refill:  0    May refill on or after August 17 2016    Medications Discontinued During This Encounter  Medication Reason  . methylphenidate 18 MG PO CR tablet Reorder  . methylphenidate 18 MG PO CR tablet  Reorder  . methylphenidate 18 MG PO CR tablet Reorder    Follow-up: No Follow-up on file.   Crecencio Mc, MD

## 2016-06-16 DIAGNOSIS — R102 Pelvic and perineal pain: Secondary | ICD-10-CM | POA: Insufficient documentation

## 2016-06-16 DIAGNOSIS — R1024 Suprapubic pain: Secondary | ICD-10-CM | POA: Insufficient documentation

## 2016-06-16 NOTE — Assessment & Plan Note (Signed)
Episodic, infrequent .  Trial of levsin,  UTI ruled out.  If recurrent will begin evaluation with ultrasound and post void residual

## 2016-06-16 NOTE — Assessment & Plan Note (Signed)
Managed well  18 mg daily. Refill history confirmed via [place] Controlled Substance databas, accessed by me today.Marland Kitchen

## 2016-06-19 ENCOUNTER — Telehealth: Payer: Self-pay | Admitting: *Deleted

## 2016-06-19 NOTE — Telephone Encounter (Signed)
Pt had a UDS collected on Monday 06/15/16. It was not picked up due to the weather conditions & office being closed x 2 days. Urine will not be ran.

## 2016-11-02 ENCOUNTER — Other Ambulatory Visit: Payer: Self-pay | Admitting: Internal Medicine

## 2016-11-03 NOTE — Telephone Encounter (Signed)
Refilled: 06/15/2016 Last OV: 06/15/2016 Next OV: not scheduled.

## 2016-11-04 ENCOUNTER — Encounter: Payer: Self-pay | Admitting: Internal Medicine

## 2016-11-05 MED ORDER — METHYLPHENIDATE HCL ER (OSM) 18 MG PO TBCR: 18.0000 mg | EXTENDED_RELEASE_TABLET | ORAL | 0 refills | Status: DC

## 2016-11-05 NOTE — Telephone Encounter (Signed)
Refilled for 30 days only.  OFFICE VISIT NEEDED prior to any more refills 

## 2016-11-05 NOTE — Telephone Encounter (Signed)
Please advise for 53month supply of medication, per the chart, this is pending in the chart from 11/02/2016.  Last refilled 06/15/2016, last OV was 06/15/2016.  No follow up scheduled. thanks

## 2016-11-05 NOTE — Telephone Encounter (Signed)
You yhve to be seen every 6 months for refill on controlled substances.  I can therefore only write for one month , until you are seen

## 2016-11-06 NOTE — Telephone Encounter (Signed)
Spoke with pt and informed her that Dr. Derrel Nip refilled her medication for 30 day because she is due for an office visit. Scheduled the pt a follow up appt. Pt is aware of appt date and time. Rx has been put up front to be picked up.

## 2016-11-09 ENCOUNTER — Encounter: Payer: Self-pay | Admitting: Internal Medicine

## 2016-11-09 ENCOUNTER — Ambulatory Visit (INDEPENDENT_AMBULATORY_CARE_PROVIDER_SITE_OTHER): Payer: BC Managed Care – PPO | Admitting: Internal Medicine

## 2016-11-09 VITALS — BP 102/60 | HR 74 | Temp 98.4°F | Resp 16 | Ht 63.0 in | Wt 128.2 lb

## 2016-11-09 DIAGNOSIS — F988 Other specified behavioral and emotional disorders with onset usually occurring in childhood and adolescence: Secondary | ICD-10-CM

## 2016-11-09 DIAGNOSIS — Z1239 Encounter for other screening for malignant neoplasm of breast: Secondary | ICD-10-CM

## 2016-11-09 DIAGNOSIS — R51 Headache: Secondary | ICD-10-CM | POA: Diagnosis not present

## 2016-11-09 DIAGNOSIS — Z1231 Encounter for screening mammogram for malignant neoplasm of breast: Secondary | ICD-10-CM

## 2016-11-09 DIAGNOSIS — R519 Headache, unspecified: Secondary | ICD-10-CM

## 2016-11-09 MED ORDER — METHYLPHENIDATE HCL ER (OSM) 18 MG PO TBCR: 18.0000 mg | EXTENDED_RELEASE_TABLET | ORAL | 0 refills | Status: DC

## 2016-11-09 NOTE — Patient Instructions (Signed)
Good to see you!  I have ordered your mammogram  I'll see you In September for your annual

## 2016-11-09 NOTE — Progress Notes (Signed)
Subjective:  Patient ID: Nicole Camacho, female    DOB: [birth year]  Age: 46 y.o. MRN: [id number]  CC: The primary encounter diagnosis was Breast cancer screening. Diagnoses of Attention deficit disorder of adult and Headache disorder were also pertinent to this visit.  HPI Nicole Camacho presents for follow up on ADD and chronic headaches.  Her ADD has been  managed with Ritalin 18 mg daily . She is functioning well at work on the dose prescribed.  Denies insomnia,  Tachycardia,  Unintentional weight loss.  Takes a holiday from the medication on weekends.  Has not requested any early refills.  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  She treates her headaches first with BC powder, which manages to resolve the HA more than 50% of the time,  Using the fioricet if the aspirin does not work after 4 hours.      Outpatient Medications Prior to Visit  Medication Sig Dispense Refill  . ALPRAZolam (XANAX) 0.25 MG tablet TAKE ONE TABLET BY MOUTH NIGHTLY AT BEDTIME ONLY AS NEEDED FOR SLEEP 30 tablet 2  . butalbital-acetaminophen-caffeine (FIORICET, ESGIC) 50-325-40 MG tablet TAKE 1-2 TABLETS BY MOUTH EVERY DAY AS NEEDED FOR HEADACHE 60 tablet 2  . cyanocobalamin 1000 MCG tablet Take 100 mcg by mouth daily.    . cyclobenzaprine (FLEXERIL) 10 MG tablet Take 1 tablet (10 mg total) by mouth 3 (three) times daily as needed for muscle spasms. 30 tablet 0  . diclofenac (VOLTAREN) 75 MG EC tablet Take 1 tablet (75 mg total) by mouth 2 (two) times daily. 60 tablet 0  . hyoscyamine (LEVSIN, ANASPAZ) 0.125 MG tablet Take 1 tablet (0.125 mg total) by mouth every 4 (four) hours as needed. 30 tablet 0  . Multiple Vitamin (MULTIVITAMIN) tablet Take 1 tablet by mouth daily.    . traMADol (ULTRAM) 50 MG tablet Take 1 tablet (50 mg total) by mouth every 6 (six) hours as needed. 90 tablet 0  .  methylphenidate 18 MG PO CR tablet Take 1 tablet (18 mg total) by mouth every morning. 30 tablet 0   No facility-administered medications prior to visit.     Review of Systems;  Patient denies headache, fevers, malaise, unintentional weight loss, skin rash, eye pain, sinus congestion and sinus pain, sore throat, dysphagia,  hemoptysis , cough, dyspnea, wheezing, chest pain, palpitations, orthopnea, edema, abdominal pain, nausea, melena, diarrhea, constipation, flank pain, dysuria, hematuria, urinary  Frequency, nocturia, numbness, tingling, seizures,  Focal weakness, Loss of consciousness,  Tremor, insomnia, depression, anxiety, and suicidal ideation.      Objective:  BP 102/60   Pulse 74   Temp 98.4 F (36.9 C) (Oral)   Resp 16   Ht 5\' 3"  (1.6 m)   Wt 128 lb 4 oz (58.2 kg)   SpO2 98%   BMI 22.72 kg/m   BP Readings from Last 3 Encounters:  11/09/16 102/60  06/15/16 118/70  02/18/16 102/64    Wt Readings from Last 3 Encounters:  11/09/16 128 lb 4 oz (58.2 kg)  06/15/16 125 lb 6.4 oz (56.9 kg)  02/18/16 125 lb 4 oz (56.8 kg)    General appearance: alert, cooperative and appears stated age Ears: normal TM's and external ear canals both ears Throat: lips, mucosa, and tongue normal; teeth and gums normal Neck: no adenopathy, no carotid bruit, supple, symmetrical, trachea midline and thyroid  not enlarged, symmetric, no tenderness/mass/nodules Back: symmetric, no curvature. ROM normal. No CVA tenderness. Lungs: clear to auscultation bilaterally Heart: regular rate and rhythm, S1, S2 normal, no murmur, click, rub or gallop Abdomen: soft, non-tender; bowel sounds normal; no masses,  no organomegaly Pulses: 2+ and symmetric Skin: Skin color, texture, turgor normal. No rashes or lesions Lymph nodes: Cervical, supraclavicular, and axillary nodes normal.  No results found for: HGBA1C  Lab Results  Component Value Date   CREATININE 0.71 02/18/2016   CREATININE 0.71 10/03/2013     Lab Results  Component Value Date   WBC 6.5 02/18/2016   HGB 14.3 02/18/2016   HCT 42.5 02/18/2016   PLT 240 02/18/2016   GLUCOSE 93 02/18/2016   CHOL 194 02/18/2016   TRIG 69 02/18/2016   HDL 86 02/18/2016   LDLCALC 94 02/18/2016   ALT 12 02/18/2016   AST 14 02/18/2016   NA 141 02/18/2016   K 4.1 02/18/2016   CL 99 02/18/2016   CREATININE 0.71 02/18/2016   BUN 14 02/18/2016   CO2 27 02/18/2016   TSH 0.837 02/18/2016    Dg Foot Complete Right  Result Date: 06/16/2013 CLINICAL DATA:  Pain. EXAM: RIGHT FOOT COMPLETE - 3+ VIEW COMPARISON:  None. FINDINGS: Soft tissue swelling noted adjacent to first MTP joint. Moderate degenerative changes are noted about the first MTP joint. No fracture or dislocation. IMPRESSION: 1. No acute abnormality. 2. Degenerative changes first MTP joint. Electronically Signed   By: Marcello Moores  Register   On: 06/16/2013 14:02    Assessment & Plan:   Problem List Items Addressed This Visit    Headache disorder     headaches occurring less than  ten times per month.  Managed with aspirin and Fioricet prn . Has not refilled the fioricet in several months per [place] database.       Attention deficit disorder of adult    Managed well  18 mg daily. Refill history confirmed via [place] Controlled Substance databas, accessed by me today.Marland Kitchen  Refills for 6 months given.          Other Visit Diagnoses    Breast cancer screening    -  Primary   Relevant Orders   MM DIGITAL SCREENING BILATERAL      I have discontinued Ms. Bader's methylphenidate, methylphenidate, methylphenidate, methylphenidate, and methylphenidate. I have also changed her methylphenidate and methylphenidate. Additionally, I am having her maintain her multivitamin, cyanocobalamin, traMADol, diclofenac, cyclobenzaprine, butalbital-acetaminophen-caffeine, ALPRAZolam, and hyoscyamine.  Meds ordered this encounter  Medications  . DISCONTD: methylphenidate 18 MG PO CR tablet    Sig: Take 1 tablet (18  mg total) by mouth every morning.    Dispense:  30 tablet    Refill:  0  . DISCONTD: methylphenidate 18 MG PO CR tablet    Sig: Take 1 tablet (18 mg total) by mouth every morning. May refill on or after December 05 2016    Dispense:  30 tablet    Refill:  0  . DISCONTD: methylphenidate 18 MG PO CR tablet    Sig: Take 1 tablet (18 mg total) by mouth every morning. May refill on or after January 05 2017    Dispense:  30 tablet    Refill:  0  . DISCONTD: methylphenidate 18 MG PO CR tablet    Sig: Take 1 tablet (18 mg total) by mouth every morning. May refill on or after February 05 2017    Dispense:  30 tablet    Refill:  0  . DISCONTD: methylphenidate 18 MG PO CR tablet    Sig: Take 1 tablet (18 mg total) by mouth every morning. May refill on or after March 07 2017    Dispense:  30 tablet    Refill:  0  . methylphenidate 18 MG PO CR tablet    Sig: Take 1 tablet (18 mg total) by mouth every morning. May refill on or after April 07 2017    Dispense:  30 tablet    Refill:  0  . methylphenidate 18 MG PO CR tablet    Sig: Take 1 tablet (18 mg total) by mouth every morning. May refill on or after May 07 2017    Dispense:  30 tablet    Refill:  0    Medications Discontinued During This Encounter  Medication Reason  . methylphenidate 18 MG PO CR tablet Reorder  . methylphenidate 18 MG PO CR tablet Reorder  . methylphenidate 18 MG PO CR tablet Reorder  . methylphenidate 18 MG PO CR tablet Reorder  . methylphenidate 18 MG PO CR tablet Reorder  . methylphenidate 18 MG PO CR tablet Reorder    Follow-up: No Follow-up on file.   Crecencio Mc, MD

## 2016-11-10 NOTE — Assessment & Plan Note (Signed)
Managed well  18 mg daily. Refill history confirmed via [place] Controlled Substance databas, accessed by me today.Marland Kitchen  Refills for 6 months given.

## 2016-11-10 NOTE — Assessment & Plan Note (Signed)
headaches occurring less than  ten times per month.  Managed with aspirin and Fioricet prn . Has not refilled the fioricet in several months per [place] database.

## 2017-03-16 LAB — HM MAMMOGRAPHY

## 2017-03-30 ENCOUNTER — Encounter: Payer: Self-pay | Admitting: Internal Medicine

## 2017-08-09 ENCOUNTER — Other Ambulatory Visit: Payer: Self-pay | Admitting: Internal Medicine

## 2017-08-10 NOTE — Telephone Encounter (Signed)
Refilled: 02/19/2016 Last OV: 11/09/2016 Next OV: not scheduled

## 2017-08-12 NOTE — Telephone Encounter (Signed)
Printed, signed and faxed.  

## 2017-10-06 ENCOUNTER — Encounter: Payer: Self-pay | Admitting: Internal Medicine

## 2017-10-06 ENCOUNTER — Ambulatory Visit: Payer: BC Managed Care – PPO | Admitting: Internal Medicine

## 2017-10-06 VITALS — BP 104/68 | HR 70 | Temp 98.1°F | Resp 14 | Ht 63.0 in | Wt 129.4 lb

## 2017-10-06 DIAGNOSIS — E785 Hyperlipidemia, unspecified: Secondary | ICD-10-CM | POA: Diagnosis not present

## 2017-10-06 DIAGNOSIS — Z90710 Acquired absence of both cervix and uterus: Secondary | ICD-10-CM | POA: Diagnosis not present

## 2017-10-06 DIAGNOSIS — R61 Generalized hyperhidrosis: Secondary | ICD-10-CM | POA: Diagnosis not present

## 2017-10-06 DIAGNOSIS — E8940 Asymptomatic postprocedural ovarian failure: Secondary | ICD-10-CM | POA: Diagnosis not present

## 2017-10-06 DIAGNOSIS — N951 Menopausal and female climacteric states: Secondary | ICD-10-CM | POA: Diagnosis not present

## 2017-10-06 DIAGNOSIS — F988 Other specified behavioral and emotional disorders with onset usually occurring in childhood and adolescence: Secondary | ICD-10-CM | POA: Diagnosis not present

## 2017-10-06 DIAGNOSIS — F5102 Adjustment insomnia: Secondary | ICD-10-CM

## 2017-10-06 MED ORDER — METHYLPHENIDATE HCL ER (OSM) 18 MG PO TBCR: 18.0000 mg | EXTENDED_RELEASE_TABLET | ORAL | 0 refills | Status: DC

## 2017-10-06 NOTE — Progress Notes (Signed)
Subjective:  Patient ID: Nicole Camacho, female    DOB: 1970-06-16  Age: 47 y.o. MRN: [id number]  CC: The primary encounter diagnosis was Menopause syndrome. Diagnoses of Hyperlipidemia LDL goal <130, Night sweats, Attention deficit disorder of adult, Insomnia due to psychological stress, and Post hysterectomy menopause syndrome were also pertinent to this visit.    HPI Nicole Camacho presents for follow up  on multiple conditions   GAD: primarily manifested as recurrent  Insomnia.  managed with low dose alprazolam. Issue is Chronic, had no improvement using over-the-counter first generation antihistamines and aggravated by new onset menopause. . Reviewed principles of good sleep hygiene.   ADD; ran out pf medication and tried for several weeks to function without it, noted a drop in organizational abilities and concentration.  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  needs to restart medication  Menopause symptoms have been limited to hot flushes and disrupted sleep   Outpatient Medications Prior to Visit  Medication Sig Dispense Refill  . ALPRAZolam (XANAX) 0.25 MG tablet TAKE ONE TABLET BY MOUTH NIGHTLY AT BEDTIME ONLY AS NEEDED FOR SLEEP 30 tablet 2  . butalbital-acetaminophen-caffeine (FIORICET, ESGIC) 50-325-40 MG tablet TAKE 1 TO 2 TABLETS BY MOUTH EVERY DAY AS NEEDED FOR HEADACHE. 60 tablet 1  . cyclobenzaprine (FLEXERIL) 10 MG tablet Take 1 tablet (10 mg total) by mouth 3 (three) times daily as needed for muscle spasms. (Patient not taking: Reported on 10/06/2017) 30 tablet 0  . diclofenac (VOLTAREN) 75 MG EC tablet Take 1 tablet (75 mg total) by mouth 2 (two) times daily. (Patient not taking: Reported on 10/06/2017) 60 tablet 0  . traMADol (ULTRAM) 50 MG tablet Take 1 tablet (50 mg total) by mouth every 6 (six) hours as needed. (Patient not taking: Reported on 10/06/2017) 90 tablet 0  . cyanocobalamin 1000 MCG tablet Take 100 mcg by  mouth daily.    . hyoscyamine (LEVSIN, ANASPAZ) 0.125 MG tablet Take 1 tablet (0.125 mg total) by mouth every 4 (four) hours as needed. (Patient not taking: Reported on 10/06/2017) 30 tablet 0  . methylphenidate 18 MG PO CR tablet Take 1 tablet (18 mg total) by mouth every morning. May refill on or after April 07 2017 (Patient not taking: Reported on 10/06/2017) 30 tablet 0  . methylphenidate 18 MG PO CR tablet Take 1 tablet (18 mg total) by mouth every morning. May refill on or after May 07 2017 (Patient not taking: Reported on 10/06/2017) 30 tablet 0  . Multiple Vitamin (MULTIVITAMIN) tablet Take 1 tablet by mouth daily.     No facility-administered medications prior to visit.     Review of Systems;  Patient denies headache, fevers, malaise, unintentional weight loss, skin rash, eye pain, sinus congestion and sinus pain, sore throat, dysphagia,  hemoptysis , cough, dyspnea, wheezing, chest pain, palpitations, orthopnea, edema, abdominal pain, nausea, melena, diarrhea, constipation, flank pain, dysuria, hematuria, urinary  Frequency, nocturia, numbness, tingling, seizures,  Focal weakness, Loss of consciousness,  Tremor, insomnia, depression, anxiety, and suicidal ideation.      Objective:  BP 104/68 (BP Location: Left Arm, Patient Position: Sitting, Cuff Size: Normal)   Pulse 70   Temp 98.1 F (36.7 C) (Oral)   Resp 14   Ht 5\' 3"  (1.6 m)   Wt 129 lb 6.4 oz (58.7 kg)   SpO2 97%   BMI 22.92 kg/m   BP Readings from Last 3 Encounters:  10/06/17 104/68  11/09/16 102/60  06/15/16 118/70    Wt Readings from Last 3 Encounters:  10/06/17 129 lb 6.4 oz (58.7 kg)  11/09/16 128 lb 4 oz (58.2 kg)  06/15/16 125 lb 6.4 oz (56.9 kg)    General appearance: alert, cooperative and appears stated age Ears: normal TM's and external ear canals both ears Throat: lips, mucosa, and tongue normal; teeth and gums normal Neck: no adenopathy, no carotid bruit, supple, symmetrical, trachea midline  and thyroid not enlarged, symmetric, no tenderness/mass/nodules Back: symmetric, no curvature. ROM normal. No CVA tenderness. Lungs: clear to auscultation bilaterally Heart: regular rate and rhythm, S1, S2 normal, no murmur, click, rub or gallop Abdomen: soft, non-tender; bowel sounds normal; no masses,  no organomegaly Pulses: 2+ and symmetric Skin: Skin color, texture, turgor normal. No rashes or lesions Lymph nodes: Cervical, supraclavicular, and axillary nodes normal.  No results found for: HGBA1C  Lab Results  Component Value Date   CREATININE 0.79 10/06/2017   CREATININE 0.71 02/18/2016   CREATININE 0.71 10/03/2013    Lab Results  Component Value Date   WBC 6.5 02/18/2016   HGB 14.3 02/18/2016   HCT 42.5 02/18/2016   PLT 240 02/18/2016   GLUCOSE 101 (H) 10/06/2017   CHOL 211 (H) 10/06/2017   TRIG 64 10/06/2017   HDL 94 10/06/2017   LDLCALC 104 (H) 10/06/2017   ALT 20 10/06/2017   AST 21 10/06/2017   NA 145 (H) 10/06/2017   K 5.0 10/06/2017   CL 101 10/06/2017   CREATININE 0.79 10/06/2017   BUN 17 10/06/2017   CO2 27 10/06/2017   TSH 1.040 10/06/2017    Dg Foot Complete Right  Result Date: 06/16/2013 CLINICAL DATA:  Pain. EXAM: RIGHT FOOT COMPLETE - 3+ VIEW COMPARISON:  None. FINDINGS: Soft tissue swelling noted adjacent to first MTP joint. Moderate degenerative changes are noted about the first MTP joint. No fracture or dislocation. IMPRESSION: 1. No acute abnormality. 2. Degenerative changes first MTP joint. Electronically Signed   By: Marcello Moores  Register   On: 06/16/2013 14:02    Assessment & Plan:   Problem List Items Addressed This Visit    Post hysterectomy menopause syndrome    Patient having hot flushes but no other symptoms. Discussed the other commonly reported symptoms including mood swings and vaginal dryness, the various hormonal and  Non hormonal ways to treat symptoms, and the risks and benefits associated with all.        Insomnia due to  psychological stress    Chronic, with no improvement using over-the-counter first generation antihistamines. Reviewed principles of good sleep hygiene. Although she is a snorer, there is no report of apneic spells by husband. Continue prn alprazolam . The risks and benefits of benzodiazepine use were discussed with patient today including excessive sedation leading to respiratory depression,  impaired thinking/driving, and addiction.  Patient was advised to avoid concurrent use with alcohol, to use medication only as needed and not to share with others  .       Attention deficit disorder of adult    Previously Managed well  18 mg daily. Refill history confirmed via [place] Controlled Substance databas, accessed by me today.Marland Kitchen  Refills for 6 months authorized before return visit is needed.   3 months given.          Other Visit Diagnoses    Menopause syndrome    -  Primary   Relevant Orders   Follicle Stimulating Hormone (Completed)   LH (Completed)  Hyperlipidemia LDL goal <130       Relevant Orders   Comprehensive metabolic panel (Completed)   Lipid Profile (Completed)   Night sweats       Relevant Orders   HIV antibody (with reflex) (Completed)   TSH (Completed)     A total of 25 minutes of face to face time was spent with patient more than half of which was spent in counselling about the above mentioned conditions  and coordination of care  I have discontinued Nicole Camacho. Mamula's multivitamin, cyanocobalamin, and hyoscyamine. I have also changed her methylphenidate, methylphenidate, and methylphenidate. Additionally, I am having her maintain her traMADol, diclofenac, cyclobenzaprine, ALPRAZolam, and butalbital-acetaminophen-caffeine.  Meds ordered this encounter  Medications  . methylphenidate 18 MG PO CR tablet    Sig: Take 1 tablet (18 mg total) by mouth every morning.    Dispense:  30 tablet    Refill:  0  . methylphenidate 18 MG PO CR tablet    Sig: Take 1 tablet (18 mg total) by  mouth every morning. May refill on or after November 05 2017    Dispense:  30 tablet    Refill:  0  . methylphenidate 18 MG PO CR tablet    Sig: Take 1 tablet (18 mg total) by mouth every morning. May refill on or after December 05 2017    Dispense:  30 tablet    Refill:  0    Medications Discontinued During This Encounter  Medication Reason  . cyanocobalamin 1000 MCG tablet Patient has not taken in last 30 days  . hyoscyamine (LEVSIN, ANASPAZ) 0.125 MG tablet Patient has not taken in last 30 days  . Multiple Vitamin (MULTIVITAMIN) tablet Patient has not taken in last 30 days  . methylphenidate 18 MG PO CR tablet Reorder  . methylphenidate 18 MG PO CR tablet Reorder    Follow-up: Return in about 6 months (around 04/08/2018) for CPE.   Crecencio Mc, MD

## 2017-10-06 NOTE — Patient Instructions (Addendum)
You  and your sister  might want to try a premixed protein drink called Premier Protein shake in the morning.  It is very good tasting,   very low sugar.    Nutritional content:  160 cal  30 g protein  1 g sugar 50% calcium needs  .  Available everywhere in 4 packs.  Just regriferate!   Tell your sister that Eating a large marshmallow daily will help decrease the liquid content of her ostomy output   Menopause Menopause is the normal time of life when menstrual periods stop completely. Menopause is complete when you have missed 12 consecutive menstrual periods. It usually occurs between the ages of 48 years and 13 years. Very rarely does a woman develop menopause before the age of 71 years. At menopause, your ovaries stop producing the female hormones estrogen and progesterone. This can cause undesirable symptoms and also affect your health. Sometimes the symptoms may occur 4-5 years before the menopause begins. There is no relationship between menopause and:  Oral contraceptives.  Number of children you had.  Race.  The age your menstrual periods started (menarche).  Heavy smokers and very thin women may develop menopause earlier in life. What are the causes?  The ovaries stop producing the female hormones estrogen and progesterone. Other causes include:  Surgery to remove both ovaries.  The ovaries stop functioning for no known reason.  Tumors of the pituitary gland in the brain.  Medical disease that affects the ovaries and hormone production.  Radiation treatment to the abdomen or pelvis.  Chemotherapy that affects the ovaries.  What are the signs or symptoms?  Hot flashes.  Night sweats.  Decrease in sex drive.  Vaginal dryness and thinning of the vagina causing painful intercourse.  Dryness of the skin and developing wrinkles.  Headaches.  Tiredness.  Irritability.  Memory problems.  Weight gain.  Bladder infections.  Hair growth of the face and  chest.  Infertility. More serious symptoms include:  Loss of bone (osteoporosis) causing breaks (fractures).  Depression.  Hardening and narrowing of the arteries (atherosclerosis) causing heart attacks and strokes.  How is this diagnosed?  When the menstrual periods have stopped for 12 straight months.  Physical exam.  Hormone studies of the blood. How is this treated? There are many treatment choices and nearly as many questions about them. The decisions to treat or not to treat menopausal changes is an individual choice made with your health care provider. Your health care provider can discuss the treatments with you. Together, you can decide which treatment will work best for you. Your treatment choices may include:  Hormone therapy (estrogen and progesterone).  Non-hormonal medicines.  Treating the individual symptoms with medicine (for example antidepressants for depression).  Herbal medicines that may help specific symptoms.  Counseling by a psychiatrist or psychologist.  Group therapy.  Lifestyle changes including: ? Eating healthy. ? Regular exercise. ? Limiting caffeine and alcohol. ? Stress management and meditation.  No treatment.  Follow these instructions at home:  Take the medicine your health care provider gives you as directed.  Get plenty of sleep and rest.  Exercise regularly.  Eat a diet that contains calcium (good for the bones) and soy products (acts like estrogen hormone).  Avoid alcoholic beverages.  Do not smoke.  If you have hot flashes, dress in layers.  Take supplements, calcium, and vitamin D to strengthen bones.  You can use over-the-counter lubricants or moisturizers for vaginal dryness.  Group therapy is sometimes  very helpful.  Acupuncture may be helpful in some cases. Contact a health care provider if:  You are not sure you are in menopause.  You are having menopausal symptoms and need advice and treatment.  You  are still having menstrual periods after age 70 years.  You have pain with intercourse.  Menopause is complete (no menstrual period for 12 months) and you develop vaginal bleeding.  You need a referral to a specialist (gynecologist, psychiatrist, or psychologist) for treatment. Get help right away if:  You have severe depression.  You have excessive vaginal bleeding.  You fell and think you have a broken bone.  You have pain when you urinate.  You develop leg or chest pain.  You have a fast pounding heart beat (palpitations).  You have severe headaches.  You develop vision problems.  You feel a lump in your breast.  You have abdominal pain or severe indigestion. This information is not intended to replace advice given to you by your health care provider. Make sure you discuss any questions you have with your health care provider. Document Released: 08/08/2003 Document Revised: 10/24/2015 Document Reviewed: 12/15/2012 Elsevier Interactive Patient Education  2017 [employer].

## 2017-10-07 LAB — COMPREHENSIVE METABOLIC PANEL
ALT: 20 IU/L (ref 0–32)
AST: 21 IU/L (ref 0–40)
Albumin/Globulin Ratio: 2.3 — ABNORMAL HIGH (ref 1.2–2.2)
Albumin: 5.1 g/dL (ref 3.5–5.5)
Alkaline Phosphatase: 35 IU/L — ABNORMAL LOW (ref 39–117)
BUN/Creatinine Ratio: 22 (ref 9–23)
BUN: 17 mg/dL (ref 6–24)
Bilirubin Total: 0.4 mg/dL (ref 0.0–1.2)
CO2: 27 mmol/L (ref 20–29)
Calcium: 10.3 mg/dL — ABNORMAL HIGH (ref 8.7–10.2)
Chloride: 101 mmol/L (ref 96–106)
Creatinine, Ser: 0.79 mg/dL (ref 0.57–1.00)
GFR calc Af Amer: 103 mL/min/{1.73_m2} (ref >59)
GFR calc non Af Amer: 89 mL/min/{1.73_m2} (ref >59)
Globulin, Total: 2.2 g/dL (ref 1.5–4.5)
Glucose: 101 mg/dL — ABNORMAL HIGH (ref 65–99)
Potassium: 5 mmol/L (ref 3.5–5.2)
Sodium: 145 mmol/L — ABNORMAL HIGH (ref 134–144)
Total Protein: 7.3 g/dL (ref 6.0–8.5)

## 2017-10-07 LAB — LIPID PANEL
Chol/HDL Ratio: 2.2 ratio (ref 0.0–4.4)
Cholesterol, Total: 211 mg/dL — ABNORMAL HIGH (ref 100–199)
HDL: 94 mg/dL (ref >39)
LDL Calculated: 104 mg/dL — ABNORMAL HIGH (ref 0–99)
Triglycerides: 64 mg/dL (ref 0–149)
VLDL Cholesterol Cal: 13 mg/dL (ref 5–40)

## 2017-10-07 LAB — LUTEINIZING HORMONE: LH: 57.5 m[IU]/mL

## 2017-10-07 LAB — FOLLICLE STIMULATING HORMONE: FSH: 128.1 m[IU]/mL

## 2017-10-07 LAB — HIV ANTIBODY (ROUTINE TESTING W REFLEX): HIV Screen 4th Generation wRfx: NONREACTIVE

## 2017-10-07 LAB — TSH: TSH: 1.04 u[IU]/mL (ref 0.450–4.500)

## 2017-10-09 DIAGNOSIS — E8940 Asymptomatic postprocedural ovarian failure: Secondary | ICD-10-CM | POA: Insufficient documentation

## 2017-10-09 DIAGNOSIS — Z90710 Acquired absence of both cervix and uterus: Secondary | ICD-10-CM

## 2017-10-09 DIAGNOSIS — F5102 Adjustment insomnia: Secondary | ICD-10-CM | POA: Insufficient documentation

## 2017-10-09 NOTE — Assessment & Plan Note (Addendum)
Previously Managed well  18 mg daily. Refill history confirmed via [place] Controlled Substance databas, accessed by me today.Marland Kitchen  Refills for 6 months authorized before return visit is needed.   3 months given.

## 2017-10-09 NOTE — Assessment & Plan Note (Signed)
Chronic, with no improvement using over-the-counter first generation antihistamines. Reviewed principles of good sleep hygiene. Although she is a snorer, there is no report of apneic spells by husband. Continue prn alprazolam . The risks and benefits of benzodiazepine use were discussed with patient today including excessive sedation leading to respiratory depression,  impaired thinking/driving, and addiction.  Patient was advised to avoid concurrent use with alcohol, to use medication only as needed and not to share with others  .

## 2017-10-09 NOTE — Assessment & Plan Note (Signed)
Patient having hot flushes but no other symptoms. Discussed the other commonly reported symptoms including mood swings and vaginal dryness, the various hormonal and  Non hormonal ways to treat symptoms, and the risks and benefits associated with all.

## 2017-11-03 ENCOUNTER — Ambulatory Visit: Payer: BC Managed Care – PPO | Admitting: Internal Medicine

## 2017-11-09 ENCOUNTER — Other Ambulatory Visit: Payer: Self-pay | Admitting: Internal Medicine

## 2017-11-09 NOTE — Telephone Encounter (Signed)
Last filled 02/20/2016 Last office visit 10/06/17 Follow up 6 months , no office visit scheduled

## 2017-12-01 ENCOUNTER — Encounter: Payer: Self-pay | Admitting: Internal Medicine

## 2018-01-28 ENCOUNTER — Other Ambulatory Visit: Payer: Self-pay | Admitting: Internal Medicine

## 2018-01-28 MED ORDER — METHYLPHENIDATE HCL ER (OSM) 18 MG PO TBCR: 18.0000 mg | EXTENDED_RELEASE_TABLET | ORAL | 0 refills | Status: DC

## 2018-01-28 NOTE — Progress Notes (Signed)
mehtylphenidate refills sent for next 3 months

## 2018-03-01 ENCOUNTER — Other Ambulatory Visit: Payer: Self-pay | Admitting: Internal Medicine

## 2018-03-03 NOTE — Telephone Encounter (Signed)
Last filled 11/09/17 Last office visit 10/06/17 No office visit scheduled

## 2018-03-23 ENCOUNTER — Other Ambulatory Visit: Payer: Self-pay | Admitting: Internal Medicine

## 2018-04-08 ENCOUNTER — Encounter: Payer: Self-pay | Admitting: Internal Medicine

## 2018-05-09 ENCOUNTER — Encounter: Payer: BC Managed Care – PPO | Admitting: Internal Medicine

## 2018-05-20 ENCOUNTER — Ambulatory Visit (INDEPENDENT_AMBULATORY_CARE_PROVIDER_SITE_OTHER): Payer: BC Managed Care – PPO | Admitting: Internal Medicine

## 2018-05-20 ENCOUNTER — Encounter: Payer: Self-pay | Admitting: Internal Medicine

## 2018-05-20 VITALS — BP 94/68 | HR 72 | Temp 97.7°F | Resp 14 | Ht 63.0 in | Wt 125.4 lb

## 2018-05-20 DIAGNOSIS — Z0000 Encounter for general adult medical examination without abnormal findings: Secondary | ICD-10-CM

## 2018-05-20 DIAGNOSIS — K2900 Acute gastritis without bleeding: Secondary | ICD-10-CM | POA: Diagnosis not present

## 2018-05-20 DIAGNOSIS — E7800 Pure hypercholesterolemia, unspecified: Secondary | ICD-10-CM

## 2018-05-20 DIAGNOSIS — F988 Other specified behavioral and emotional disorders with onset usually occurring in childhood and adolescence: Secondary | ICD-10-CM

## 2018-05-20 DIAGNOSIS — R110 Nausea: Secondary | ICD-10-CM | POA: Diagnosis not present

## 2018-05-20 MED ORDER — ALPRAZOLAM 0.25 MG PO TABS: 0.2500 mg | ORAL_TABLET | Freq: Every evening | ORAL | 5 refills | Status: DC | PRN

## 2018-05-20 MED ORDER — METHYLPHENIDATE HCL ER 20 MG PO TBCR: 20.0000 mg | EXTENDED_RELEASE_TABLET | ORAL | 0 refills | Status: DC

## 2018-05-20 MED ORDER — CYCLOBENZAPRINE HCL 10 MG PO TABS: 10.0000 mg | ORAL_TABLET | Freq: Three times a day (TID) | ORAL | 11 refills | Status: DC | PRN

## 2018-05-20 NOTE — Patient Instructions (Signed)
Trial of increased dose of methylphenidate for one month to 20 mg dail  Do not take it on Sundays (drug holiday)  Ok to continue famotidine twice daily    Best wishes for a joyous Christmas season and a happy New Year!  Deborra Medina, MD
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
## 2018-05-21 LAB — COMPREHENSIVE METABOLIC PANEL
A/G RATIO: 2.2 (ref 1.2–2.2)
ALT: 13 IU/L (ref 0–32)
AST: 18 IU/L (ref 0–40)
Albumin: 4.7 g/dL (ref 3.5–5.5)
Alkaline Phosphatase: 36 IU/L — ABNORMAL LOW (ref 39–117)
BUN / CREAT RATIO: 20 (ref 9–23)
BUN: 15 mg/dL (ref 6–24)
Bilirubin Total: 0.4 mg/dL (ref 0.0–1.2)
CO2: 22 mmol/L (ref 20–29)
CREATININE: 0.74 mg/dL (ref 0.57–1.00)
Calcium: 9.9 mg/dL (ref 8.7–10.2)
Chloride: 104 mmol/L (ref 96–106)
GFR, EST AFRICAN AMERICAN: 111 mL/min/{1.73_m2} (ref >59)
GFR, EST NON AFRICAN AMERICAN: 96 mL/min/{1.73_m2} (ref >59)
GLOBULIN, TOTAL: 2.1 g/dL (ref 1.5–4.5)
Glucose: 91 mg/dL (ref 65–99)
Potassium: 5.2 mmol/L (ref 3.5–5.2)
Sodium: 143 mmol/L (ref 134–144)
Total Protein: 6.8 g/dL (ref 6.0–8.5)

## 2018-05-21 LAB — LIPID PANEL
Chol/HDL Ratio: 2.2 ratio (ref 0.0–4.4)
Cholesterol, Total: 209 mg/dL — ABNORMAL HIGH (ref 100–199)
HDL: 93 mg/dL (ref >39)
LDL CALC: 105 mg/dL — AB (ref 0–99)
Triglycerides: 56 mg/dL (ref 0–149)
VLDL Cholesterol Cal: 11 mg/dL (ref 5–40)

## 2018-05-22 DIAGNOSIS — K2970 Gastritis, unspecified, without bleeding: Secondary | ICD-10-CM | POA: Insufficient documentation

## 2018-05-22 NOTE — Assessment & Plan Note (Signed)
Trial of increased dose of Adderall XR to 20 mg for one month for decreased attentiveness In late afternoon

## 2018-05-22 NOTE — Assessment & Plan Note (Signed)

## 2018-05-22 NOTE — Assessment & Plan Note (Addendum)
H Pyloir screening is negative.  Trial of famotidine 20 mg twice daily

## 2018-05-27 LAB — H PYLORI, IGM, IGG, IGA AB: H. pylori, IgG AbS: 0.25 Index Value (ref 0.00–0.79)

## 2018-05-27 LAB — SPECIMEN STATUS REPORT

## 2018-06-03 ENCOUNTER — Telehealth: Payer: Self-pay | Admitting: Internal Medicine

## 2018-06-03 MED ORDER — METHYLPHENIDATE HCL ER (OSM) 18 MG PO TBCR: 18.0000 mg | EXTENDED_RELEASE_TABLET | ORAL | 0 refills | Status: DC

## 2018-06-03 NOTE — Telephone Encounter (Signed)
Copied from [place] [phone]. Topic: Quick Communication - See Telephone Encounter >> Jun 03, 2018  2:56 PM Blase Mess A wrote: CRM for notification. See Telephone encounter for: 06/03/18.  Jessica From [facility] is calling regarding methylphenidate 20 MG PO ER tablet [[id number]] according to the patient the medication is on back order.  Seeing if Dr. Derrel Nip will ok the 18mg ?  Please advise    [facility] [address] [place], [place] [phone] (Phone) [phone] (Fax)

## 2018-06-03 NOTE — Telephone Encounter (Signed)
Please advise 

## 2018-06-03 NOTE — Telephone Encounter (Signed)
Yes,  18 mg dose approved and sent

## 2018-06-14 ENCOUNTER — Other Ambulatory Visit: Payer: Self-pay | Admitting: Internal Medicine

## 2018-06-14 MED ORDER — METHYLPHENIDATE HCL ER 20 MG PO TBCR: 20.0000 mg | EXTENDED_RELEASE_TABLET | ORAL | 0 refills | Status: DC

## 2018-06-14 NOTE — Progress Notes (Signed)
Nicole Camacho [facility] [place] [facility] the methylphen[facility]ate ER 20 mg.  It has been resent to [facility]

## 2018-07-19 ENCOUNTER — Ambulatory Visit: Payer: Self-pay | Admitting: *Deleted

## 2018-07-19 NOTE — Telephone Encounter (Signed)
Pt calling to request a prescription for Tamiflu to [facility] [place] on [address] Abbott Camacho has not been around anyone that was confirmed to have the flu but states she interacted with hundreds of people at her son's wedding this weekend. Pt states that her throat feels itchy and her eyes are sore. Pt's husband also has complaints of sore throat, headache, chills body aches and fever that began on today.  Pt has received the flu vaccine this year.  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 No history of chronic medical conditions.   Answer Assessment - Initial Assessment Questions 1. TYPE of EXPOSURE: "How were you exposed?" (e.g., close contact, not a close contact)     Unsure of exposure 2. DATE of EXPOSURE: "When did the exposure occur?" (e.g., hour, days, weeks)     No confirmed exposure to the flu, pt's wife states they were around hundreds of people for their son's wedding this weekend and now the pt's husband is sick 3. PREGNANCY: "Is there any chance you are pregnant?" "When was your last menstrual period?"     No, LMP- no menstrial cycles 4. HIGH RISK for COMPLICATIONS: "Do you have any heart or lung problems? Do you have a weakened immune system?" (e.g., CHF, COPD, asthma, HIV positive, chemotherapy, renal failure, diabetes mellitus, sickle cell anemia)     no 5. SYMPTOMS: "Do you have any symptoms?" (e.g., cough, fever, sore throat, difficulty breathing).     Itchy throat and eyes feel sore  Protocols used: INFLUENZA EXPOSURE-A-AH

## 2018-07-20 NOTE — Telephone Encounter (Signed)
Called patient.  Left message and patient returned call.  Spoke to pt.  Patient was concerned about her husband who has been having flu-like symptoms.  Pt. Stated that she is only having itchy throat and eye pain.  Pt stated that her son had gotten married over the weekend and they had been around hundreds of people and asked about an appointment for today.  Informed pt that Dr. Derrel Nip is not in the office today and all other providers are booked, including NP Arnett who is covering for Dr. Derrel Nip.  Informed pt that I had spoken to the Clinical Team Lead who recommended that her husband be seen in urgent care today.  Informed pt that I have already spoken to her husband and he had agreed to go to Urgent Care.  Pt inquired about a preventative medicine for herself if her husband has the flu.  Informed pt that Urgent Care should be able to give her a preventative prescription if her husband was to test positive for the flu.  Advised pt to call back tomorrow for an appt if they are not able to make it to Urgent Care today.

## 2018-07-20 NOTE — Telephone Encounter (Signed)
Noted  Agree, needs to be seen at urgent care

## 2018-08-19 ENCOUNTER — Other Ambulatory Visit: Payer: Self-pay | Admitting: Internal Medicine

## 2018-08-19 NOTE — Telephone Encounter (Signed)
Refilled: 08/10/2017 Last OV: 05/20/2018 Next OV: 11/18/2018

## 2018-09-14 MED ORDER — METHYLPHENIDATE HCL ER 20 MG PO TBCR: 20.0000 mg | EXTENDED_RELEASE_TABLET | ORAL | 0 refills | Status: DC

## 2018-09-15 ENCOUNTER — Telehealth: Payer: Self-pay

## 2018-09-15 NOTE — Telephone Encounter (Signed)
PA for methylphenidate has been submitted on covermymeds.

## 2018-09-16 DIAGNOSIS — F900 Attention-deficit hyperactivity disorder, predominantly inattentive type: Secondary | ICD-10-CM

## 2018-09-16 NOTE — Telephone Encounter (Signed)
PA has been approved through 09/14/2021.

## 2018-11-04 MED ORDER — METHYLPHENIDATE HCL ER 20 MG PO TBCR: 20.0000 mg | EXTENDED_RELEASE_TABLET | ORAL | 0 refills | Status: DC

## 2018-11-04 NOTE — Telephone Encounter (Signed)
Refilled: 09/14/2018 Last OV: 05/20/2018 Next OV: 11/18/2018

## 2018-11-18 ENCOUNTER — Encounter: Payer: Self-pay | Admitting: Internal Medicine

## 2018-11-18 ENCOUNTER — Ambulatory Visit (INDEPENDENT_AMBULATORY_CARE_PROVIDER_SITE_OTHER): Payer: BC Managed Care – PPO | Admitting: Internal Medicine

## 2018-11-18 ENCOUNTER — Other Ambulatory Visit: Payer: Self-pay

## 2018-11-18 DIAGNOSIS — F988 Other specified behavioral and emotional disorders with onset usually occurring in childhood and adolescence: Secondary | ICD-10-CM | POA: Diagnosis not present

## 2018-11-18 DIAGNOSIS — F5102 Adjustment insomnia: Secondary | ICD-10-CM

## 2018-11-18 MED ORDER — ALPRAZOLAM 0.25 MG PO TABS: 0.2500 mg | ORAL_TABLET | Freq: Every evening | ORAL | 5 refills | Status: DC | PRN

## 2018-11-18 MED ORDER — METHYLPHENIDATE HCL ER 20 MG PO TBCR: 20.0000 mg | EXTENDED_RELEASE_TABLET | ORAL | 0 refills | Status: DC

## 2018-11-18 MED ORDER — TRAZODONE HCL 50 MG PO TABS: 25.0000 mg | ORAL_TABLET | Freq: Every evening | ORAL | 3 refills | Status: DC | PRN
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
## 2018-11-20 NOTE — Assessment & Plan Note (Signed)
Trial of trazodone advised,  Avoid benzodiazepines .  Also recommending trial use of  meditation app called "Headspace>"

## 2018-11-20 NOTE — Assessment & Plan Note (Addendum)
Tolerating Metadate  XR dose increase to  20 mg for  decreased attentiveness In late afternoon .  Will refill for 6 months.  Follow up due in December

## 2018-12-10 ENCOUNTER — Other Ambulatory Visit: Payer: Self-pay | Admitting: Internal Medicine

## 2019-04-19 ENCOUNTER — Other Ambulatory Visit: Payer: Self-pay

## 2019-04-20 MED ORDER — METHYLPHENIDATE HCL ER 20 MG PO TBCR: 20.0000 mg | EXTENDED_RELEASE_TABLET | ORAL | 0 refills | Status: DC

## 2019-04-20 NOTE — Telephone Encounter (Signed)
Refilled: 02/02/2019 Last OV: 11/18/2018 Next OV: 05/22/2019

## 2019-04-20 NOTE — Telephone Encounter (Signed)
Refilled  .  She needs a 6 month follow up schedlued prio to more refills

## 2019-05-15 ENCOUNTER — Telehealth: Payer: Self-pay | Admitting: Internal Medicine

## 2019-05-15 DIAGNOSIS — Z0000 Encounter for general adult medical examination without abnormal findings: Secondary | ICD-10-CM

## 2019-05-15 DIAGNOSIS — E7800 Pure hypercholesterolemia, unspecified: Secondary | ICD-10-CM

## 2019-05-15 NOTE — Telephone Encounter (Signed)
She does not need the tdap again,  Just the Td.  And the labs are great thanks!

## 2019-05-15 NOTE — Telephone Encounter (Signed)
Pt would like to have labs done prior to her physical scheduled for 05/22/2019. She would also like to have a tdap booster, do you want her to have tdap or td?   Labs ordered: CMP, Lipid, CBC, TSH, and A1c. Is there anything else that needs to be ordered?

## 2019-05-15 NOTE — Telephone Encounter (Signed)
Pt has a virtual cpe on 12/21 with [place]. Pt would like to have labs done before and also a booster tdap. Orders are needed please.

## 2019-05-16 ENCOUNTER — Other Ambulatory Visit: Payer: Self-pay

## 2019-05-16 NOTE — Telephone Encounter (Signed)
Labs orders have been placed and she can have the Td booster. She does not need the tdap this time.

## 2019-05-18 ENCOUNTER — Other Ambulatory Visit (INDEPENDENT_AMBULATORY_CARE_PROVIDER_SITE_OTHER): Payer: BC Managed Care – PPO

## 2019-05-18 ENCOUNTER — Other Ambulatory Visit: Payer: Self-pay

## 2019-05-18 ENCOUNTER — Ambulatory Visit (INDEPENDENT_AMBULATORY_CARE_PROVIDER_SITE_OTHER): Payer: BC Managed Care – PPO

## 2019-05-18 DIAGNOSIS — Z0000 Encounter for general adult medical examination without abnormal findings: Secondary | ICD-10-CM | POA: Diagnosis not present

## 2019-05-18 DIAGNOSIS — Z23 Encounter for immunization: Secondary | ICD-10-CM

## 2019-05-18 DIAGNOSIS — E7800 Pure hypercholesterolemia, unspecified: Secondary | ICD-10-CM

## 2019-05-19 LAB — CBC WITH DIFFERENTIAL/PLATELET
Basophils Absolute: 0 10*3/uL (ref 0.0–0.2)
Basos: 0 %
EOS (ABSOLUTE): 0.1 10*3/uL (ref 0.0–0.4)
Eos: 2 %
Hematocrit: 41.3 % (ref 34.0–46.6)
Hemoglobin: 14.3 g/dL (ref 11.1–15.9)
Immature Grans (Abs): 0 10*3/uL (ref 0.0–0.1)
Immature Granulocytes: 0 %
Lymphocytes Absolute: 1.7 10*3/uL (ref 0.7–3.1)
Lymphs: 25 %
MCH: 31.1 pg (ref 26.6–33.0)
MCHC: 34.6 g/dL (ref 31.5–35.7)
MCV: 90 fL (ref 79–97)
Monocytes Absolute: 0.6 10*3/uL (ref 0.1–0.9)
Monocytes: 9 %
Neutrophils Absolute: 4.3 10*3/uL (ref 1.4–7.0)
Neutrophils: 64 %
Platelets: 200 10*3/uL (ref 150–450)
RBC: 4.6 x10E6/uL (ref 3.77–5.28)
RDW: 12.4 % (ref 11.7–15.4)
WBC: 6.7 10*3/uL (ref 3.4–10.8)

## 2019-05-19 LAB — COMPREHENSIVE METABOLIC PANEL
ALT: 14 IU/L (ref 0–32)
AST: 17 IU/L (ref 0–40)
Albumin/Globulin Ratio: 2.4 — ABNORMAL HIGH (ref 1.2–2.2)
Albumin: 4.8 g/dL (ref 3.8–4.8)
Alkaline Phosphatase: 31 IU/L — ABNORMAL LOW (ref 39–117)
BUN/Creatinine Ratio: 20 (ref 9–23)
BUN: 14 mg/dL (ref 6–24)
Bilirubin Total: 0.4 mg/dL (ref 0.0–1.2)
CO2: 24 mmol/L (ref 20–29)
Calcium: 9.6 mg/dL (ref 8.7–10.2)
Chloride: 103 mmol/L (ref 96–106)
Creatinine, Ser: 0.71 mg/dL (ref 0.57–1.00)
GFR calc Af Amer: 116 mL/min/{1.73_m2} (ref >59)
GFR calc non Af Amer: 100 mL/min/{1.73_m2} (ref >59)
Globulin, Total: 2 g/dL (ref 1.5–4.5)
Glucose: 91 mg/dL (ref 65–99)
Potassium: 3.7 mmol/L (ref 3.5–5.2)
Sodium: 142 mmol/L (ref 134–144)
Total Protein: 6.8 g/dL (ref 6.0–8.5)

## 2019-05-19 LAB — HEMOGLOBIN A1C
Est. average glucose Bld gHb Est-mCnc: 103 mg/dL
Hgb A1c MFr Bld: 5.2 % (ref 4.8–5.6)

## 2019-05-19 LAB — LIPID PANEL
Chol/HDL Ratio: 2.1 ratio (ref 0.0–4.4)
Cholesterol, Total: 204 mg/dL — ABNORMAL HIGH (ref 100–199)
HDL: 99 mg/dL (ref >39)
LDL Chol Calc (NIH): 94 mg/dL (ref 0–99)
Triglycerides: 59 mg/dL (ref 0–149)
VLDL Cholesterol Cal: 11 mg/dL (ref 5–40)

## 2019-05-19 LAB — TSH: TSH: 0.797 u[IU]/mL (ref 0.450–4.500)

## 2019-05-22 ENCOUNTER — Ambulatory Visit (INDEPENDENT_AMBULATORY_CARE_PROVIDER_SITE_OTHER): Payer: BC Managed Care – PPO | Admitting: Internal Medicine

## 2019-05-22 ENCOUNTER — Other Ambulatory Visit: Payer: Self-pay

## 2019-05-22 ENCOUNTER — Encounter: Payer: Self-pay | Admitting: Internal Medicine

## 2019-05-22 VITALS — Ht 63.0 in | Wt 128.0 lb

## 2019-05-22 DIAGNOSIS — F988 Other specified behavioral and emotional disorders with onset usually occurring in childhood and adolescence: Secondary | ICD-10-CM

## 2019-05-22 DIAGNOSIS — Z0000 Encounter for general adult medical examination without abnormal findings: Secondary | ICD-10-CM | POA: Diagnosis not present

## 2019-05-22 LAB — HM MAMMOGRAPHY

## 2019-05-22 MED ORDER — METHYLPHENIDATE HCL ER 20 MG PO TBCR: 20.0000 mg | EXTENDED_RELEASE_TABLET | ORAL | 0 refills | Status: DC
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
## 2019-05-23 ENCOUNTER — Telehealth: Payer: Self-pay | Admitting: Internal Medicine

## 2019-05-23 ENCOUNTER — Encounter: Payer: Self-pay | Admitting: Emergency Medicine

## 2019-05-23 ENCOUNTER — Other Ambulatory Visit: Payer: Self-pay

## 2019-05-23 ENCOUNTER — Ambulatory Visit
Admission: EM | Admit: 2019-05-23 | Discharge: 2019-05-23 | Disposition: A | Discharge status: Home / Self Care | Payer: BC Managed Care – PPO | Attending: Emergency Medicine | Admitting: Emergency Medicine

## 2019-05-23 ENCOUNTER — Encounter: Payer: Self-pay | Admitting: Internal Medicine

## 2019-05-23 DIAGNOSIS — J029 Acute pharyngitis, unspecified: Secondary | ICD-10-CM

## 2019-05-23 DIAGNOSIS — R21 Rash and other nonspecific skin eruption: Secondary | ICD-10-CM | POA: Diagnosis present

## 2019-05-23 LAB — POCT RAPID STREP A (OFFICE): Rapid Strep A Screen: NEGATIVE

## 2019-05-23 MED ORDER — METHYLPREDNISOLONE SODIUM SUCC 125 MG IJ SOLR
125.0000 mg | Freq: Once | INTRAMUSCULAR | Status: AC
  Administered 2019-05-23: 125 mg via INTRAMUSCULAR

## 2019-05-23 MED ORDER — PREDNISONE 10 MG (21) PO TBPK: ORAL_TABLET | Freq: Every day | ORAL | 0 refills | Status: DC

## 2019-05-23 NOTE — Assessment & Plan Note (Signed)

## 2019-05-23 NOTE — Discharge Instructions (Addendum)
You were given an injection of a steroid called Solu-Medrol.  Continue to take the Benadryl every 6 hours for 2 to 3 days.  Do not drive, drink alcohol, operate machinery with Benadryl as it may cause drowsiness.    Take the prednisone as directed starting 05/24/2019.    Follow-up with your primary care provider if your symptoms are not improving.    Go to the emergency department if you have difficulty swallowing or breathing.
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
## 2019-05-23 NOTE — ED Triage Notes (Signed)
Patient in office today c/o had some candy and hamburger that doesn't usually eat last night Throat started feeling stratchy then woke up this morning with rash over body  OTC: Benadryl last night and 1 hour ago

## 2019-05-23 NOTE — Telephone Encounter (Signed)
FYI.Marland KitchenMarland Kitchenpt is going to [facility].

## 2019-05-23 NOTE — Telephone Encounter (Signed)
Pt had appt yesterday for a cpe  Today she is now having a rash all over has heat and she is also having an itch throat  Please contact pt [phone]

## 2019-05-23 NOTE — Assessment & Plan Note (Signed)
Tolerating Metadate  XR dose increase to  20 mg for  decreased attentiveness In late afternoon .  Will refill for 6 months.

## 2019-05-23 NOTE — Telephone Encounter (Signed)
Rash all over slightly raised under skin  And itchy throat, took benadryl 25 mg , this am and one about an hour ago , has not helped but has not gotten worse. Advised since benadryl has not relieved her symptoms and throaty is itchy and tingling she should be evaluated at [facility] across from office , patient speaking in full sentences and no difficulty patient stated with breathing she will have someone drive to UC.

## 2019-05-25 LAB — NOVEL CORONAVIRUS, NAA: SARS-CoV-2, NAA: NOT DETECTED

## 2019-05-26 LAB — CULTURE, GROUP A STREP (THRC)

## 2019-07-08 ENCOUNTER — Other Ambulatory Visit: Payer: Self-pay | Admitting: Internal Medicine

## 2019-08-17 ENCOUNTER — Other Ambulatory Visit: Payer: Self-pay | Admitting: Internal Medicine

## 2019-08-17 ENCOUNTER — Telehealth: Payer: Self-pay | Admitting: Internal Medicine

## 2019-08-17 NOTE — Telephone Encounter (Signed)
Spoke with Nicole Camacho and she has been scheduled with Dr. Nicki Reaper for tomorrow as a virtual visit.

## 2019-08-17 NOTE — Telephone Encounter (Signed)
Pt called she is wanting something called in for poison oak  Pt is request an appt also

## 2019-08-18 ENCOUNTER — Other Ambulatory Visit: Payer: Self-pay

## 2019-08-18 ENCOUNTER — Other Ambulatory Visit: Payer: Self-pay | Admitting: Internal Medicine

## 2019-08-18 ENCOUNTER — Telehealth (INDEPENDENT_AMBULATORY_CARE_PROVIDER_SITE_OTHER): Payer: BC Managed Care – PPO | Admitting: Internal Medicine

## 2019-08-18 ENCOUNTER — Encounter: Payer: Self-pay | Admitting: Internal Medicine

## 2019-08-18 DIAGNOSIS — L255 Unspecified contact dermatitis due to plants, except food: Secondary | ICD-10-CM | POA: Diagnosis not present

## 2019-08-18 DIAGNOSIS — R21 Rash and other nonspecific skin eruption: Secondary | ICD-10-CM | POA: Diagnosis not present

## 2019-08-18 MED ORDER — NYSTATIN 100000 UNIT/GM EX CREA: 1.0000 "application " | TOPICAL_CREAM | Freq: Two times a day (BID) | CUTANEOUS | 0 refills | Status: DC

## 2019-08-18 MED ORDER — TRIAMCINOLONE ACETONIDE 0.1 % EX CREA: 1.0000 "application " | TOPICAL_CREAM | Freq: Two times a day (BID) | CUTANEOUS | 0 refills | Status: DC

## 2019-08-18 MED ORDER — METHYLPREDNISOLONE 4 MG PO TBPK: ORAL_TABLET | ORAL | 0 refills | Status: DC

## 2019-08-18 NOTE — Telephone Encounter (Signed)
Refilled: 08/21/2018 Last OV: 05/22/2019 Next OV: not scheduled
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
## 2019-08-18 NOTE — Telephone Encounter (Signed)
[facility] [place] and states that they can not refill medrol dosepak because of a nationwide recall and would like to know if you could call in prednisone instead.

## 2019-08-18 NOTE — Telephone Encounter (Signed)
Verbal called in for Prednisone 10 mg tablets- take 4 tabs day 1 and then decrease by 1/2 tablet until off. Orders per Dr Nicki Reaper

## 2019-08-26 ENCOUNTER — Encounter: Payer: Self-pay | Admitting: Internal Medicine

## 2019-08-26 DIAGNOSIS — L259 Unspecified contact dermatitis, unspecified cause: Secondary | ICD-10-CM | POA: Insufficient documentation

## 2019-08-26 DIAGNOSIS — R21 Rash and other nonspecific skin eruption: Secondary | ICD-10-CM | POA: Insufficient documentation

## 2019-08-26 NOTE — Assessment & Plan Note (Signed)
Rash appears to be c/w contact dermatitis.  Was working outside.  Contact with poison oak.  Given the area involved, will treat with medrol dosepack as directed.  Also TCC cream as directed.  She has taken oral steroids previously and tolerated.  Follow.

## 2019-08-26 NOTE — Assessment & Plan Note (Signed)
Rash beneath breast appears to be c/w yeast.  Treat with nystatin cream as directed.  Discussed keeping the area dry.  Follow.

## 2019-08-28 ENCOUNTER — Other Ambulatory Visit: Payer: Self-pay

## 2019-08-28 ENCOUNTER — Other Ambulatory Visit: Payer: Self-pay | Admitting: Internal Medicine

## 2019-08-28 ENCOUNTER — Telehealth: Payer: Self-pay | Admitting: Internal Medicine

## 2019-08-28 MED ORDER — METHYLPREDNISOLONE 4 MG PO TBPK: ORAL_TABLET | ORAL | 0 refills | Status: DC

## 2019-08-28 MED ORDER — PREDNISONE 10 MG PO TABS: ORAL_TABLET | ORAL | 0 refills | Status: DC

## 2019-08-28 NOTE — Telephone Encounter (Signed)
Spoke with pt to get more information. Patient was seen on 3/19 for poison ivy on her hand/arm up to her elbow. Was given 8 day prednisone taper- finished 3/26. The places on her arm healed up. Starting yesterday she developed the same kind of rash but it is now on her back at her waistline and around into her groin area. She is wondering if she may need another prednisone taper or if she needs another visit. She is fine with which ever you prefer.

## 2019-08-28 NOTE — Telephone Encounter (Signed)
Please see pharmacy note on RX they need replacement/different sent in

## 2019-08-28 NOTE — Telephone Encounter (Signed)
Please advise 

## 2019-08-28 NOTE — Telephone Encounter (Signed)
Patient aware and sent in prednisone to replace medrol due to pharmacy being out of medrol dose pak

## 2019-08-28 NOTE — Telephone Encounter (Signed)
Pt had video visit on 08/18/19 with Dr. Nicki Reaper for poison ivy and she prescribed her an antibiotic and she finished it. Now she is broke out all over and want to know if she needs another antibiotic or something else. She would like a call back.

## 2019-08-28 NOTE — Telephone Encounter (Signed)
I'll call in another round of prednisone,  And if it doesn't get better she'll need to be SEEN

## 2019-09-27 ENCOUNTER — Other Ambulatory Visit: Payer: Self-pay

## 2019-09-28 ENCOUNTER — Other Ambulatory Visit: Payer: Self-pay | Admitting: Internal Medicine

## 2019-09-28 MED ORDER — METHYLPHENIDATE HCL ER 20 MG PO TBCR: 20.0000 mg | EXTENDED_RELEASE_TABLET | ORAL | 0 refills | Status: DC

## 2019-09-28 NOTE — Telephone Encounter (Signed)
Refill request for metadate, last seen 08-18-19, last filled 07-21-19.  Please advise.

## 2019-11-01 ENCOUNTER — Encounter: Payer: Self-pay | Admitting: Nurse Practitioner

## 2019-11-01 ENCOUNTER — Other Ambulatory Visit: Payer: Self-pay

## 2019-11-01 ENCOUNTER — Ambulatory Visit (INDEPENDENT_AMBULATORY_CARE_PROVIDER_SITE_OTHER): Payer: BC Managed Care – PPO | Admitting: Nurse Practitioner

## 2019-11-01 ENCOUNTER — Other Ambulatory Visit
Admission: RE | Admit: 2019-11-01 | Discharge: 2019-11-01 | Disposition: A | Discharge status: Home / Self Care | Payer: BC Managed Care – PPO | Source: Ambulatory Visit | Attending: Nurse Practitioner | Admitting: Nurse Practitioner

## 2019-11-01 ENCOUNTER — Telehealth: Payer: Self-pay | Admitting: Internal Medicine

## 2019-11-01 VITALS — BP 110/78 | HR 85 | Temp 97.0°F | Ht 65.2 in | Wt 133.0 lb

## 2019-11-01 DIAGNOSIS — R221 Localized swelling, mass and lump, neck: Secondary | ICD-10-CM

## 2019-11-01 DIAGNOSIS — J029 Acute pharyngitis, unspecified: Secondary | ICD-10-CM

## 2019-11-01 DIAGNOSIS — E042 Nontoxic multinodular goiter: Secondary | ICD-10-CM | POA: Insufficient documentation

## 2019-11-01 LAB — GROUP A STREP BY PCR: Group A Strep by PCR: NOT DETECTED

## 2019-11-01 NOTE — Assessment & Plan Note (Signed)
Thyroid panel and thyroid US. Stat strep negative.

## 2019-11-01 NOTE — Patient Instructions (Addendum)
Please  go to the lab today.  Strep test at Urgent Care  I am suspecting a new onset goiter.  We will check thyroid labs and ultrasound of the thyroid.  Further recommendations regarding endocrinology referral versus ENT referral pending results.  Monitor for worsening hoarseness, difficulty breathing, and promptly report for emergency care if this occurs.    Sore Throat A sore throat is pain, burning, irritation, or scratchiness in the throat. When you have a sore throat, you may feel pain or tenderness in your throat when you swallow or talk. Many things can cause a sore throat, including:  An infection.  Seasonal allergies.  Dryness in the air.  Irritants, such as smoke or pollution.  Radiation treatment to the area.  Gastroesophageal reflux disease (GERD).  A tumor. A sore throat is often the first sign of another sickness. It may happen with other symptoms, such as coughing, sneezing, fever, and swollen neck glands. Most sore throats go away without medical treatment. Follow these instructions at home:      Take over-the-counter medicines only as told by your health care provider. ? If your child has a sore throat, do not give your child aspirin because of the association with Reye syndrome.  Drink enough fluids to keep your urine pale yellow.  Rest as needed.  To help with pain, try: ? Sipping warm liquids, such as broth, herbal tea, or warm water. ? Eating or drinking cold or frozen liquids, such as frozen ice pops. ? Gargling with a salt-water mixture 3-4 times a day or as needed. To make a salt-water mixture, completely dissolve -1 tsp (3-6 g) of salt in 1 cup (237 mL) of warm water. ? Sucking on hard candy or throat lozenges. ? Putting a cool-mist humidifier in your bedroom at night to moisten the air. ? Sitting in the bathroom with the door closed for 5-10 minutes while you run hot water in the shower.  Do not use any products that contain nicotine or  tobacco, such as cigarettes, e-cigarettes, and chewing tobacco. If you need help quitting, ask your health care provider.  Wash your hands well and often with soap and water. If soap and water are not available, use hand sanitizer. Contact a health care provider if:  You have a fever for more than 2-3 days.  You have symptoms that last (are persistent) for more than 2-3 days.  Your throat does not get better within 7 days.  You have a fever and your symptoms suddenly get worse.  Your child who is 3 months to 75 years old has a temperature of 102.26F (39C) or higher. Get help right away if:  You have difficulty breathing.  You cannot swallow fluids, soft foods, or your saliva.  You have increased swelling in your throat or neck.  You have persistent nausea and vomiting. Summary  A sore throat is pain, burning, irritation, or scratchiness in the throat. Many things can cause a sore throat.  Take over-the-counter medicines only as told by your health care provider. Do not give your child aspirin.  Drink plenty of fluids, and rest as needed.  Contact a health care provider if your symptoms worsen or your sore throat does not get better within 7 days. This information is not intended to replace advice given to you by your health care provider. Make sure you discuss any questions you have with your health care provider. Document Revised: 10/18/2017 Document Reviewed: 10/18/2017 Elsevier Patient Education  [place].

## 2019-11-01 NOTE — Telephone Encounter (Signed)
Left pt a vm to call ofc to sch US. 
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
## 2019-11-01 NOTE — Telephone Encounter (Signed)
Left pt vm with appt time date and location.

## 2019-11-02 ENCOUNTER — Other Ambulatory Visit: Payer: Self-pay

## 2019-11-02 ENCOUNTER — Ambulatory Visit
Admission: RE | Admit: 2019-11-02 | Discharge: 2019-11-02 | Disposition: A | Discharge status: Home / Self Care | Payer: BC Managed Care – PPO | Source: Ambulatory Visit | Attending: Nurse Practitioner | Admitting: Nurse Practitioner

## 2019-11-02 DIAGNOSIS — R221 Localized swelling, mass and lump, neck: Secondary | ICD-10-CM | POA: Insufficient documentation

## 2019-11-02 LAB — THYROID PANEL WITH TSH
Free Thyroxine Index: 1.7 (ref 1.2–4.9)
T3 Uptake Ratio: 24 % (ref 24–39)
T4, Total: 7.1 ug/dL (ref 4.5–12.0)
TSH: 0.821 u[IU]/mL (ref 0.450–4.500)

## 2019-11-04 ENCOUNTER — Telehealth: Payer: Self-pay | Admitting: Nurse Practitioner

## 2019-11-04 DIAGNOSIS — E049 Nontoxic goiter, unspecified: Secondary | ICD-10-CM

## 2019-11-05 NOTE — Telephone Encounter (Signed)
Thyroid enlarged with nodules on [place].  Nicole Camacho would like to see Dr. Jenetta DownerDina Rich in  Endocrinology at [facility] and referral placed.

## 2019-12-29 ENCOUNTER — Other Ambulatory Visit: Payer: Self-pay

## 2020-01-01 ENCOUNTER — Other Ambulatory Visit: Payer: Self-pay | Admitting: Internal Medicine

## 2020-01-01 MED ORDER — METHYLPHENIDATE HCL ER 20 MG PO TBCR: 20.0000 mg | EXTENDED_RELEASE_TABLET | ORAL | 0 refills | Status: DC

## 2020-01-01 NOTE — Telephone Encounter (Signed)
Refill request for metadate, last seen 05-22-19, last filled 05-22-19.  Please advise.

## 2020-01-03 MED ORDER — ALPRAZOLAM 0.25 MG PO TABS: 0.2500 mg | ORAL_TABLET | Freq: Every evening | ORAL | 5 refills | Status: DC | PRN

## 2020-02-02 ENCOUNTER — Telehealth: Payer: Self-pay | Admitting: Internal Medicine

## 2020-02-02 MED ORDER — METHYLPHENIDATE HCL ER 20 MG PO TBCR: 20.0000 mg | EXTENDED_RELEASE_TABLET | ORAL | 0 refills | Status: DC

## 2020-02-02 NOTE — Telephone Encounter (Signed)
Refill request for metadate er, last seen 21DEC2020, last filled [id number].  Please advise. Has appointment [phone] @1600 

## 2020-02-02 NOTE — Telephone Encounter (Signed)
Pt needs a refill on methylphenidate (METADATE ER) 20 MG ER tablet [facility] to [facility] [place]  Only 2 pills left

## 2020-02-12 ENCOUNTER — Encounter: Payer: Self-pay | Admitting: Internal Medicine

## 2020-02-12 ENCOUNTER — Ambulatory Visit (INDEPENDENT_AMBULATORY_CARE_PROVIDER_SITE_OTHER): Payer: BC Managed Care – PPO | Admitting: Internal Medicine

## 2020-02-12 ENCOUNTER — Other Ambulatory Visit: Payer: Self-pay

## 2020-02-12 VITALS — BP 110/58 | HR 74 | Temp 98.4°F | Resp 14 | Ht 63.0 in | Wt 130.8 lb

## 2020-02-12 DIAGNOSIS — E042 Nontoxic multinodular goiter: Secondary | ICD-10-CM

## 2020-02-12 DIAGNOSIS — F988 Other specified behavioral and emotional disorders with onset usually occurring in childhood and adolescence: Secondary | ICD-10-CM | POA: Diagnosis not present

## 2020-02-12 DIAGNOSIS — D1724 Benign lipomatous neoplasm of skin and subcutaneous tissue of left leg: Secondary | ICD-10-CM

## 2020-02-12 DIAGNOSIS — F5102 Adjustment insomnia: Secondary | ICD-10-CM

## 2020-02-12 DIAGNOSIS — R519 Headache, unspecified: Secondary | ICD-10-CM | POA: Diagnosis not present

## 2020-02-12 NOTE — Assessment & Plan Note (Addendum)
She was referred to Endicrinology for evaluation  (see overview for comments ) .  TSH is normal.  Given the dize of the nodules,  No biopsy was advised.  She will Follow up in one year with Endocrinology

## 2020-02-12 NOTE — Progress Notes (Signed)
Subjective:  Patient ID: Nicole Camacho, female    DOB: 03-24-1971  Age: 49 y.o. MRN: [id number]  CC: The primary encounter diagnosis was Lipoma of left lower extremity. Diagnoses of Multinodular goiter (nontoxic), Attention deficit disorder of adult, Headache disorder, and Insomnia due to psychological stress were also pertinent to this visit.  HPI Nicole Camacho presents for medication refill   Last seen virtually Dec 2020   This visit occurred during the SARS-CoV-2 public health emergency.  Safety protocols were in place, including screening questions prior to the visit, additional usage of staff PPE, and extensive cleaning of exam room while observing appropriate contact time as indicated for disinfecting solutions.    Patient has received both doses of the available COVID 19 vaccine without complications.  Patient continues to mask when outside of the home except when walking in yard or at safe distances from others .  Patient denies any change in mood or development of unhealthy behaviors resuting from the pandemic's restriction of activities and socialization.    Left neck pain, chronic,  Radiates to shoulder and deltoid.  Gets weekly massage ,uses an inversion table , TENS unit   Uses a back pack instead of a purse when heavy. Managing resultant occipital headaches with fioricet, not more than 2 daily   Lipoma left anterior thigh , noticed recently during shaving.  Had dermatology evaluate it and due to its width and size they were unwilling to remove.  feels that it is growing.   Insomnia: using alprazolam sparingly.  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  Feels overhwelmed at times.  Averages 45 hours plus additional time not paid for.   ADD:  Feels that her current regimen is effective without side effects.   Outpatient Medications Prior to Visit  Medication Sig Dispense Refill  . ALPRAZolam (XANAX) 0.25 MG tablet  Take 1 tablet (0.25 mg total) by mouth at bedtime as needed. for sleep 30 tablet 5  . butalbital-acetaminophen-caffeine (FIORICET) 50-325-40 MG tablet TAKE 1-2 TABLETS BY MOUTH EVERY DAY AS NEEDED FOR HEADACHE 48 tablet 2  . cyclobenzaprine (FLEXERIL) 10 MG tablet Take 1 tablet (10 mg total) by mouth 3 (three) times daily as needed for muscle spasms. 30 tablet 11  . methylphenidate (METADATE ER) 20 MG ER tablet Take 1 tablet (20 mg total) by mouth every morning. 30 tablet 0  . nystatin cream (MYCOSTATIN) Apply 1 application topically 2 (two) times daily. Apply beneath breasts - to affected area 30 g 0  . traZODone (DESYREL) 50 MG tablet TAKE 1/2 TO 1 TABLET BY MOUTH AT BEDTIME AS NEEDED FOR SLEEP 90 tablet 2   No facility-administered medications prior to visit.    Review of Systems;  Patient denies headache, fevers, malaise, unintentional weight loss, skin rash, eye pain, sinus congestion and sinus pain, sore throat, dysphagia,  hemoptysis , cough, dyspnea, wheezing, chest pain, palpitations, orthopnea, edema, abdominal pain, nausea, melena, diarrhea, constipation, flank pain, dysuria, hematuria, urinary  Frequency, nocturia, numbness, tingling, seizures,  Focal weakness, Loss of consciousness,  Tremor, insomnia, depression, anxiety, and suicidal ideation.      Objective:  BP (!) 110/58 (BP Location: Left Arm, Patient Position: Sitting, Cuff Size: Normal)   Pulse 74   Temp 98.4 F (36.9 C) (Oral)   Resp 14   Ht 5\' 3"  (1.6 m)   Wt 130 lb 12.8 oz (59.3 kg)   SpO2 97%  BMI 23.17 kg/m   BP Readings from Last 3 Encounters:  02/12/20 (!) 110/58  11/01/19 110/78  05/23/19 125/80    Wt Readings from Last 3 Encounters:  02/12/20 130 lb 12.8 oz (59.3 kg)  11/01/19 133 lb (60.3 kg)  08/18/19 129 lb 8 oz (58.7 kg)    General appearance: alert, cooperative and appears stated age Ears: normal TM's and external ear canals both ears Throat: lips, mucosa, and tongue normal; teeth and gums  normal Neck: no adenopathy, no carotid bruit, supple, symmetrical, trachea midline and thyroid not enlarged, symmetric, no tenderness/mass/nodules Back: symmetric, no curvature. ROM normal. No CVA tenderness. Lungs: clear to auscultation bilaterally Heart: regular rate and rhythm, S1, S2 normal, no murmur, click, rub or gallop Abdomen: soft, non-tender; bowel sounds normal; no masses,  no organomegaly Pulses: 2+ and symmetric Skin: Skin color, texture, turgor normal. No rashes or lesions Lymph nodes: Cervical, supraclavicular, and axillary nodes normal.  Lab Results  Component Value Date   HGBA1C 5.2 05/18/2019    Lab Results  Component Value Date   CREATININE 0.71 05/18/2019   CREATININE 0.74 05/20/2018   CREATININE 0.79 10/06/2017    Lab Results  Component Value Date   WBC 6.7 05/18/2019   HGB 14.3 05/18/2019   HCT 41.3 05/18/2019   PLT 200 05/18/2019   GLUCOSE 91 05/18/2019   CHOL 204 (H) 05/18/2019   TRIG 59 05/18/2019   HDL 99 05/18/2019   LDLCALC 94 05/18/2019   ALT 14 05/18/2019   AST 17 05/18/2019   NA 142 05/18/2019   K 3.7 05/18/2019   CL 103 05/18/2019   CREATININE 0.71 05/18/2019   BUN 14 05/18/2019   CO2 24 05/18/2019   TSH 0.821 11/01/2019   HGBA1C 5.2 05/18/2019    US THYROID  Result Date: 11/03/2019 CLINICAL DATA:  Enlarged thyroid on exam EXAM: THYROID ULTRASOUND TECHNIQUE: Ultrasound examination of the thyroid gland and adjacent soft tissues was performed. COMPARISON:  None. FINDINGS: Parenchymal Echotexture: Mildly heterogenous Isthmus: 4 mm Right lobe: 6.1 x 2.1 x 2.4 cm Left lobe: 6.2 x 2.3 x 2.2 cm _________________________________________________________ Estimated total number of nodules >/= 1 cm: 3 Number of spongiform nodules >/=  2 cm not described below (TR1): 0 Number of mixed cystic and solid nodules >/= 1.5 cm not described below ([place]): 0 _________________________________________________________ Nodule # 4: Location: Left; Inferior Maximum  size: 2.2 cm; Other 2 dimensions: 2.0 x 2.0 cm Composition: cystic/almost completely cystic (0) Echogenicity: anechoic (0) Shape: not taller-than-wide (0) Margins: smooth (0) Echogenic foci: none (0) ACR TI-RADS total points: 0. ACR TI-RADS risk category: TR1 (0-1 points). ACR TI-RADS recommendations: This nodule does NOT meet TI-RADS criteria for biopsy or dedicated follow-up. _________________________________________________________ There are additional scattered right thyroid mixed cystic/solid nodules noted, all measuring 1 cm or less in size. These also would not meet criteria for any biopsy or follow-up. Nonspecific minor thyroid enlargement and heterogeneity. Normal vascularity. No regional adenopathy. IMPRESSION: 2.2 cm left inferior TR 1 cystic nodule does not meet criteria for biopsy or follow-up. Additional right thyroid benign mixed cystic/solid nodules all measuring 1 cm or less in size. Nonspecific mild thyroid enlargement The above is in keeping with the ACR TI-RADS recommendations - J Am Coll Radiol 2017;14:587-595. Electronically Signed   By: Jerilynn Mages.  Shick M.D.   On: 11/03/2019 08:23    Assessment & Plan:   Problem List Items Addressed This Visit      Unprioritized   Attention deficit disorder of adult  Tolerating Metadate  XR dose increase to  20 mg for  decreased attentiveness In late afternoon . No side effects.    Will refill for 6 months.         Headache disorder     headaches occurring less than  ten times per month.  Managed with aspirin and Fioricet prn  Occasionally adds Flexeril for trapezius spasm. Marland Kitchen Has not refilled the fioricet in several months per [place] database.       Insomnia due to psychological stress    Using alprazolam very rarely,  Trazodone nightly .  The risks and benefits of benzodiazepine use were reviewed with patient today including excessive sedation leading to respiratory depression,  impaired thinking/driving, and addiction.  Patient was advised to avoid  concurrent use with alcohol, to use medication only as needed and not to share with others  .       Multinodular goiter (nontoxic)    She was referred to Endicrinology for evaluation  (see overview for comments ) .  TSH is normal.  Given the dize of the nodules,  No biopsy was advised.  She will Follow up in one year with Endocrinology       Other Visit Diagnoses    Lipoma of left lower extremity    -  Primary   Relevant Orders   Ambulatory referral to General Surgery      I am having Nicole Chess. Camacho maintain her cyclobenzaprine, traZODone, butalbital-acetaminophen-caffeine, nystatin cream, ALPRAZolam, and methylphenidate.  No orders of the defined types were placed in this encounter.   There are no discontinued medications.  Follow-up: No follow-ups on file.   Crecencio Mc, MD

## 2020-02-12 NOTE — Assessment & Plan Note (Addendum)
Tolerating Metadate  XR dose increase to  20 mg for  decreased attentiveness In late afternoon . No side effects.    Will refill for 6 months.

## 2020-02-13 NOTE — Assessment & Plan Note (Signed)
Using alprazolam very rarely,  Trazodone nightly .  The risks and benefits of benzodiazepine use were reviewed with patient today including excessive sedation leading to respiratory depression,  impaired thinking/driving, and addiction.  Patient was advised to avoid concurrent use with alcohol, to use medication only as needed and not to share with others  .

## 2020-02-13 NOTE — Assessment & Plan Note (Addendum)
headaches occurring less than  ten times per month.  Managed with aspirin and Fioricet prn  Occasionally adds Flexeril for trapezius spasm. Marland Kitchen Has not refilled the fioricet in several months per  database.

## 2020-02-29 ENCOUNTER — Other Ambulatory Visit: Payer: Self-pay | Admitting: General Surgery

## 2020-02-29 DIAGNOSIS — R2242 Localized swelling, mass and lump, left lower limb: Secondary | ICD-10-CM

## 2020-03-12 ENCOUNTER — Other Ambulatory Visit: Payer: Self-pay

## 2020-03-12 NOTE — Telephone Encounter (Signed)
Refill request for metadate, last seen 02-12-20, last filled 02-02-20.  Please advise.

## 2020-03-13 MED ORDER — METHYLPHENIDATE HCL ER 20 MG PO TBCR: 20.0000 mg | EXTENDED_RELEASE_TABLET | ORAL | 0 refills | Status: DC

## 2020-03-13 NOTE — Telephone Encounter (Signed)
Unable to refuse rx due to you already refilled yesterday

## 2020-03-17 ENCOUNTER — Ambulatory Visit
Admission: RE | Admit: 2020-03-17 | Discharge: 2020-03-17 | Disposition: A | Discharge status: Home / Self Care | Payer: BC Managed Care – PPO | Source: Ambulatory Visit | Attending: General Surgery | Admitting: General Surgery

## 2020-03-17 ENCOUNTER — Other Ambulatory Visit: Payer: Self-pay

## 2020-03-17 DIAGNOSIS — R2242 Localized swelling, mass and lump, left lower limb: Secondary | ICD-10-CM | POA: Insufficient documentation

## 2020-03-17 MED ORDER — GADOBUTROL 1 MMOL/ML IV SOLN
6.0000 mL | Freq: Once | INTRAVENOUS | Status: AC | PRN
  Administered 2020-03-17: 6 mL via INTRAVENOUS

## 2020-03-26 ENCOUNTER — Telehealth: Payer: Self-pay

## 2020-03-26 ENCOUNTER — Telehealth (INDEPENDENT_AMBULATORY_CARE_PROVIDER_SITE_OTHER): Payer: BC Managed Care – PPO | Admitting: Internal Medicine

## 2020-03-26 ENCOUNTER — Encounter: Payer: Self-pay | Admitting: Internal Medicine

## 2020-03-26 ENCOUNTER — Other Ambulatory Visit: Payer: Self-pay

## 2020-03-26 DIAGNOSIS — F988 Other specified behavioral and emotional disorders with onset usually occurring in childhood and adolescence: Secondary | ICD-10-CM | POA: Diagnosis not present

## 2020-03-26 DIAGNOSIS — L02429 Furuncle of limb, unspecified: Secondary | ICD-10-CM | POA: Insufficient documentation

## 2020-03-26 MED ORDER — METHYLPHENIDATE HCL ER 20 MG PO TBCR: 20.0000 mg | EXTENDED_RELEASE_TABLET | ORAL | 0 refills | Status: DC

## 2020-03-26 MED ORDER — DOXYCYCLINE HYCLATE 100 MG PO TABS: 100.0000 mg | ORAL_TABLET | Freq: Two times a day (BID) | ORAL | 0 refills | Status: DC

## 2020-03-26 NOTE — Assessment & Plan Note (Signed)
Likely Staph Aureus,  Caused by sharing deodorant with husband who had similar boil several days ago.  She has a septra allergy.  Will use doxycycline 100 mg bid. X 7-10 days. Warm compresses .  Probiotic

## 2020-03-26 NOTE — Assessment & Plan Note (Signed)
Tolerating Metadate  XR dose increase to  20 mg for  decreased attentiveness In late afternoon . No side effects.    Nov, Dec, and January refills sent to [facility]
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
## 2020-03-26 NOTE — Telephone Encounter (Signed)
Patient has appointment with PCP today at [facility] Patient Name: Nicole Camacho Gender: Female DOB: 1970-09-14 Age: 49 Y 7 D Return Phone Number: [phone] (Primary) Address: City/State/Zip: [place] [address] Client [facility] Night - Cl Client Site [place] Physician Deborra Medina - MD Contact Type Call Who Is Calling Patient / Member / Family / Caregiver Call Type Triage / Clinical Relationship To Patient Self Return Phone Number [phone] (Primary) Chief Complaint Wound Infection Reason for Call Symptomatic / Request for Health Information Initial Comment Dr. Derrel Nip recently called in a RX for her husband when he developed an infection in his armpit. They were sharing deodorant. She now has inflammation in both armpits but one side is worse. She is asking for abx so hers doesn't get as infected as his did. Translation No Nurse Assessment Nurse: Clovis Riley, RN, Georgina Peer Date/Time (Eastern Time): 03/26/2020 8:54:53 AM Confirm and document reason for call. If symptomatic, describe symptoms. ---States Dr. Derrel Nip recently called in a RX(sulfa) for her husband when he developed an infection in his armpit. They were sharing deodorant. She now has inflammation in both armpits but one side is worse. She is asking for abx so hers doesn't get as infected as his did. States the left side a very inflammed, red, and hot to touch. No fever. States the left side is very tender 5/10. Does the patient have any new or worsening symptoms? ---Yes Will a triage be completed? ---Yes Related visit to physician within the last 2 weeks? ---No Does the PT have any chronic conditions? (i.e. diabetes, asthma, this includes High risk factors for pregnancy, etc.) ---No Is the patient pregnant or possibly pregnant? (Ask all females between the ages  of 15-55) ---No Is this a behavioral health or substance abuse call? ---No Guidelines Guideline Title Affirmed Question Affirmed Notes Nurse Date/Time Eilene Ghazi Time) Lymph Nodes - Swollen [1] Overlying skin is red AND [2] no fever Clovis Riley, RN, Georgina Peer 03/26/2020 8:58:28 AM Disp. Time Eilene Ghazi Time) Disposition Final User 03/26/2020 8:46:22 AM Attempt made - message left Clovis Riley, RN, Georgina Peer PLEASE NOTE: All timestamps contained within this report are represented as [place] Standard Time. CONFIDENTIALTY NOTICE: This fax transmission is intended only for the addressee. It contains information that is legally privileged, confidential or otherwise protected from use or disclosure. If you are not the intended recipient, you are strictly prohibited from reviewing, disclosing, copying using or disseminating any of this information or taking any action in reliance on or regarding this information. If you have received this fax in error, please notify us immediately by telephone so that we can arrange for its return to [place]. Phone: [phone], Toll-Free: [phone], Fax: [phone] Page: 2 of 2 Call Id: [id number] 03/26/2020 9:01:42 AM See PCP within 24 Hours Yes Clovis Riley, RN, Leilani Merl Disagree/Comply Comply Caller Understands Yes PreDisposition Call Doctor Care Advice Given Per Guideline SEE PCP WITHIN 24 HOURS: * IF OFFICE WILL BE OPEN: You need to be examined within the next 24 hours. Call your doctor (or NP/PA) when the office opens and make an appointment. PAIN MEDICINES: * ACETAMINOPHEN - EXTRA STRENGTH TYLENOL: Take 1,000 mg (two 500 mg pills) every 8 hours as needed. Each Extra Strength Tylenol pill has 500 mg of acetaminophen. The most you should take each day is 3,000 mg (6 pills a day). * IBUPROFEN (E.G., MOTRIN, ADVIL): Take 400 mg (two 200 mg pills) by  mouth every 6 hours. The most you should take each day is 1,200 mg (six 200 mg pills), unless your doctor has told you to take more.  CALL BACK IF: * You become worse CARE ADVICE given per Lymph Nodes Swollen (Adult) guideline. Referrals REFERRED TO PCP OFFICE

## 2020-04-17 ENCOUNTER — Other Ambulatory Visit: Payer: Self-pay

## 2020-04-17 ENCOUNTER — Other Ambulatory Visit: Payer: Self-pay | Admitting: Internal Medicine

## 2020-04-17 MED ORDER — DOXYCYCLINE HYCLATE 100 MG PO TABS: 100.0000 mg | ORAL_TABLET | Freq: Two times a day (BID) | ORAL | 0 refills | Status: DC

## 2020-05-09 ENCOUNTER — Other Ambulatory Visit: Payer: Self-pay | Admitting: Internal Medicine

## 2020-05-22 ENCOUNTER — Encounter: Payer: BC Managed Care – PPO | Admitting: Internal Medicine

## 2020-05-23 ENCOUNTER — Other Ambulatory Visit: Payer: Self-pay | Admitting: Internal Medicine

## 2020-05-23 NOTE — Telephone Encounter (Signed)
Doxycycline has been refilled , remind her to apply hot compresses  Every few hours to area.  Dr. Derrel Nip saw message and filled doxycycline

## 2020-05-23 NOTE — Telephone Encounter (Signed)
Pt needs a refill on this  She is having another flair up

## 2020-05-23 NOTE — Telephone Encounter (Signed)
Patient is having a flare up on the lump under arm. pcp previously has given patient dixy to help with sx.

## 2020-05-23 NOTE — Telephone Encounter (Signed)
Doxycycline has been refilled , remind her to apply hot compresses  Every few hours to area.

## 2020-05-27 ENCOUNTER — Encounter: Payer: BC Managed Care – PPO | Admitting: Internal Medicine

## 2020-05-29 ENCOUNTER — Ambulatory Visit: Payer: BC Managed Care – PPO | Admitting: Internal Medicine

## 2020-05-29 ENCOUNTER — Other Ambulatory Visit: Payer: Self-pay

## 2020-05-29 ENCOUNTER — Encounter: Payer: Self-pay | Admitting: Internal Medicine

## 2020-05-29 VITALS — BP 106/62 | HR 88 | Temp 98.4°F | Resp 14 | Ht 63.0 in | Wt 130.2 lb

## 2020-05-29 DIAGNOSIS — L02429 Furuncle of limb, unspecified: Secondary | ICD-10-CM | POA: Diagnosis not present

## 2020-05-29 DIAGNOSIS — E042 Nontoxic multinodular goiter: Secondary | ICD-10-CM

## 2020-05-29 DIAGNOSIS — Z1231 Encounter for screening mammogram for malignant neoplasm of breast: Secondary | ICD-10-CM | POA: Diagnosis not present

## 2020-05-29 DIAGNOSIS — E559 Vitamin D deficiency, unspecified: Secondary | ICD-10-CM

## 2020-05-29 DIAGNOSIS — Z1211 Encounter for screening for malignant neoplasm of colon: Secondary | ICD-10-CM

## 2020-05-29 DIAGNOSIS — Z0000 Encounter for general adult medical examination without abnormal findings: Secondary | ICD-10-CM | POA: Diagnosis not present

## 2020-05-29 DIAGNOSIS — F988 Other specified behavioral and emotional disorders with onset usually occurring in childhood and adolescence: Secondary | ICD-10-CM

## 2020-05-29 DIAGNOSIS — E785 Hyperlipidemia, unspecified: Secondary | ICD-10-CM

## 2020-05-29 DIAGNOSIS — R5383 Other fatigue: Secondary | ICD-10-CM

## 2020-05-29 DIAGNOSIS — Z23 Encounter for immunization: Secondary | ICD-10-CM
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
## 2020-05-29 NOTE — Patient Instructions (Signed)
Health Maintenance, Female Adopting a healthy lifestyle and getting preventive care are important in promoting health and wellness. Ask your health care provider about:  The right schedule for you to have regular tests and exams.  Things you can do on your own to prevent diseases and keep yourself healthy. What should I know about diet, weight, and exercise? Eat a healthy diet   Eat a diet that includes plenty of vegetables, fruits, low-fat dairy products, and lean protein.  Do not eat a lot of foods that are high in solid fats, added sugars, or sodium. Maintain a healthy weight Body mass index (BMI) is used to identify weight problems. It estimates body fat based on height and weight. Your health care provider can help determine your BMI and help you achieve or maintain a healthy weight. Get regular exercise Get regular exercise. This is one of the most important things you can do for your health. Most adults should:  Exercise for at least 150 minutes each week. The exercise should increase your heart rate and make you sweat (moderate-intensity exercise).  Do strengthening exercises at least twice a week. This is in addition to the moderate-intensity exercise.  Spend less time sitting. Even light physical activity can be beneficial. Watch cholesterol and blood lipids Have your blood tested for lipids and cholesterol at 50 years of age, then have this test every 5 years. Have your cholesterol levels checked more often if:  Your lipid or cholesterol levels are high.  You are older than 50 years of age.  You are at high risk for heart disease. What should I know about cancer screening? Depending on your health history and family history, you may need to have cancer screening at various ages. This may include screening for:  Breast cancer.  Cervical cancer.  Colorectal cancer.  Skin cancer.  Lung cancer. What should I know about heart disease, diabetes, and high blood  pressure? Blood pressure and heart disease  High blood pressure causes heart disease and increases the risk of stroke. This is more likely to develop in people who have high blood pressure readings, are of African descent, or are overweight.  Have your blood pressure checked: ? Every 3-5 years if you are 18-39 years of age. ? Every year if you are 40 years old or older. Diabetes Have regular diabetes screenings. This checks your fasting blood sugar level. Have the screening done:  Once every three years after age 40 if you are at a normal weight and have a low risk for diabetes.  More often and at a younger age if you are overweight or have a high risk for diabetes. What should I know about preventing infection? Hepatitis B If you have a higher risk for hepatitis B, you should be screened for this virus. Talk with your health care provider to find out if you are at risk for hepatitis B infection. Hepatitis C Testing is recommended for:  Everyone born from 1945 through 1965.  Anyone with known risk factors for hepatitis C. Sexually transmitted infections (STIs)  Get screened for STIs, including gonorrhea and chlamydia, if: ? You are sexually active and are younger than 50 years of age. ? You are older than 50 years of age and your health care provider tells you that you are at risk for this type of infection. ? Your sexual activity has changed since you were last screened, and you are at increased risk for chlamydia or gonorrhea. Ask your health care provider if   you are at risk.  Ask your health care provider about whether you are at high risk for HIV. Your health care provider may recommend a prescription medicine to help prevent HIV infection. If you choose to take medicine to prevent HIV, you should first get tested for HIV. You should then be tested every 3 months for as long as you are taking the medicine. Pregnancy  If you are about to stop having your period (premenopausal) and  you may become pregnant, seek counseling before you get pregnant.  Take 400 to 800 micrograms (mcg) of folic acid every day if you become pregnant.  Ask for birth control (contraception) if you want to prevent pregnancy. Osteoporosis and menopause Osteoporosis is a disease in which the bones lose minerals and strength with aging. This can result in bone fractures. If you are 65 years old or older, or if you are at risk for osteoporosis and fractures, ask your health care provider if you should:  Be screened for bone loss.  Take a calcium or vitamin D supplement to lower your risk of fractures.  Be given hormone replacement therapy (HRT) to treat symptoms of menopause. Follow these instructions at home: Lifestyle  Do not use any products that contain nicotine or tobacco, such as cigarettes, e-cigarettes, and chewing tobacco. If you need help quitting, ask your health care provider.  Do not use street drugs.  Do not share needles.  Ask your health care provider for help if you need support or information about quitting drugs. Alcohol use  Do not drink alcohol if: ? Your health care provider tells you not to drink. ? You are pregnant, may be pregnant, or are planning to become pregnant.  If you drink alcohol: ? Limit how much you use to 0-1 drink a day. ? Limit intake if you are breastfeeding.  Be aware of how much alcohol is in your drink. In the [place] one drink equals one 12 oz bottle of beer (355 mL), one 5 oz glass of wine (148 mL), or one 1 oz glass of hard liquor (44 mL). General instructions  Schedule regular health, dental, and eye exams.  Stay current with your vaccines.  Tell your health care provider if: ? You often feel depressed. ? You have ever been abused or do not feel safe at home. Summary  Adopting a healthy lifestyle and getting preventive care are important in promoting health and wellness.  Follow your health care provider's instructions about healthy  diet, exercising, and getting tested or screened for diseases.  Follow your health care provider's instructions on monitoring your cholesterol and blood pressure. This information is not intended to replace advice given to you by your health care provider. Make sure you discuss any questions you have with your health care provider. Document Revised: 05/11/2018 Document Reviewed: 05/11/2018 Elsevier Patient Education  2020 Elsevier Inc.  

## 2020-05-30 LAB — COMPREHENSIVE METABOLIC PANEL
ALT: 18 IU/L (ref 0–32)
AST: 21 IU/L (ref 0–40)
Albumin/Globulin Ratio: 2 (ref 1.2–2.2)
Albumin: 4.4 g/dL (ref 3.8–4.8)
Alkaline Phosphatase: 36 IU/L — ABNORMAL LOW (ref 44–121)
BUN/Creatinine Ratio: 17 (ref 9–23)
BUN: 12 mg/dL (ref 6–24)
Bilirubin Total: 0.4 mg/dL (ref 0.0–1.2)
CO2: 25 mmol/L (ref 20–29)
Calcium: 9.8 mg/dL (ref 8.7–10.2)
Chloride: 101 mmol/L (ref 96–106)
Creatinine, Ser: 0.72 mg/dL (ref 0.57–1.00)
GFR calc Af Amer: 113 mL/min/{1.73_m2} (ref >59)
GFR calc non Af Amer: 98 mL/min/{1.73_m2} (ref >59)
Globulin, Total: 2.2 g/dL (ref 1.5–4.5)
Glucose: 80 mg/dL (ref 65–99)
Potassium: 4 mmol/L (ref 3.5–5.2)
Sodium: 139 mmol/L (ref 134–144)
Total Protein: 6.6 g/dL (ref 6.0–8.5)

## 2020-05-30 LAB — CBC WITH DIFFERENTIAL/PLATELET
Basophils Absolute: 0 10*3/uL (ref 0.0–0.2)
Basos: 0 %
EOS (ABSOLUTE): 0.1 10*3/uL (ref 0.0–0.4)
Eos: 2 %
Hematocrit: 40.5 % (ref 34.0–46.6)
Hemoglobin: 13.4 g/dL (ref 11.1–15.9)
Immature Grans (Abs): 0 10*3/uL (ref 0.0–0.1)
Immature Granulocytes: 0 %
Lymphocytes Absolute: 1.8 10*3/uL (ref 0.7–3.1)
Lymphs: 37 %
MCH: 29.8 pg (ref 26.6–33.0)
MCHC: 33.1 g/dL (ref 31.5–35.7)
MCV: 90 fL (ref 79–97)
Monocytes Absolute: 0.5 10*3/uL (ref 0.1–0.9)
Monocytes: 11 %
Neutrophils Absolute: 2.4 10*3/uL (ref 1.4–7.0)
Neutrophils: 50 %
Platelets: 243 10*3/uL (ref 150–450)
RBC: 4.5 x10E6/uL (ref 3.77–5.28)
RDW: 11.8 % (ref 11.7–15.4)
WBC: 4.8 10*3/uL (ref 3.4–10.8)

## 2020-05-30 LAB — LIPID PANEL
Chol/HDL Ratio: 2.2 ratio (ref 0.0–4.4)
Cholesterol, Total: 204 mg/dL — ABNORMAL HIGH (ref 100–199)
HDL: 91 mg/dL (ref >39)
LDL Chol Calc (NIH): 105 mg/dL — ABNORMAL HIGH (ref 0–99)
Triglycerides: 45 mg/dL (ref 0–149)
VLDL Cholesterol Cal: 8 mg/dL (ref 5–40)

## 2020-05-30 LAB — VITAMIN D 25 HYDROXY (VIT D DEFICIENCY, FRACTURES): Vit D, 25-Hydroxy: 29.5 ng/mL — ABNORMAL LOW (ref 30.0–100.0)

## 2020-05-30 LAB — TSH: TSH: 0.768 u[IU]/mL (ref 0.450–4.500)

## 2020-05-30 LAB — SARS-COV-2 SEMI-QUANTITATIVE TOTAL ANTIBODY, SPIKE
SARS-CoV-2 Semi-Quant Total Ab: >2500 U/mL (ref <0.8)
SARS-CoV-2 Spike Ab Interp: POSITIVE

## 2020-05-31 NOTE — Assessment & Plan Note (Addendum)
She has had recurrent flares in the same axillary fold that partially resolve with antibiotics. She is using antibacterial soeap.  Recommend weekly use of Hibiclens,  And  Will refer to Gen surg Adela Glimpse  for I & D.

## 2020-05-31 NOTE — Assessment & Plan Note (Signed)
Tolerating Metadate  XR without side effects.    Nov, Dec, and January ref[facility].

## 2020-05-31 NOTE — Assessment & Plan Note (Signed)

## 2020-06-01 NOTE — Progress Notes (Signed)
Happy New Year!  You have a VERY  robust COVID antibody titre. Your CBC, thyroid ,cholesterol,  liver and kidney function are NORMAL .   You are not anemic or prediabetic  Your vitamin D is borderline low.  If you are not taking a D3 supplement,  please start taking 1000 [place] daiLy.  If you are ,  Please increase to 2000 IUs daily. Low Vitamin D can increase your risk of weak bones and fractures and interfere with your body's ability to absorb the calcium in your diet.       Please Plan to repeat fasting labs in 1 year.    Regards,   Duncan Dull, MD

## 2020-08-06 ENCOUNTER — Other Ambulatory Visit: Payer: Self-pay

## 2020-08-07 MED ORDER — METHYLPHENIDATE HCL ER 20 MG PO TBCR: 20.0000 mg | EXTENDED_RELEASE_TABLET | ORAL | 0 refills | Status: DC

## 2020-08-14 ENCOUNTER — Ambulatory Visit: Payer: BC Managed Care – PPO | Admitting: Family

## 2020-08-14 ENCOUNTER — Other Ambulatory Visit: Payer: Self-pay

## 2020-08-14 ENCOUNTER — Telehealth: Payer: Self-pay

## 2020-08-14 ENCOUNTER — Encounter: Payer: Self-pay | Admitting: Family

## 2020-08-14 VITALS — BP 102/68 | HR 77 | Temp 98.3°F | Ht 62.0 in | Wt 131.2 lb

## 2020-08-14 DIAGNOSIS — H00019 Hordeolum externum unspecified eye, unspecified eyelid: Secondary | ICD-10-CM | POA: Insufficient documentation

## 2020-08-14 DIAGNOSIS — Z1211 Encounter for screening for malignant neoplasm of colon: Secondary | ICD-10-CM

## 2020-08-14 DIAGNOSIS — L03032 Cellulitis of left toe: Secondary | ICD-10-CM | POA: Diagnosis not present

## 2020-08-14 DIAGNOSIS — H00011 Hordeolum externum right upper eyelid: Secondary | ICD-10-CM | POA: Diagnosis not present

## 2020-08-14 DIAGNOSIS — L03039 Cellulitis of unspecified toe: Secondary | ICD-10-CM | POA: Insufficient documentation

## 2020-08-14 MED ORDER — DOXYCYCLINE HYCLATE 100 MG PO TABS: 100.0000 mg | ORAL_TABLET | Freq: Two times a day (BID) | ORAL | 0 refills | Status: DC
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
## 2020-08-14 NOTE — Telephone Encounter (Signed)
Pt was here today to see Joycelyn Schmid. She asked if you would place a referral for colonoscopy to a [facility] provider. She said that she could take care of mammogram since order was in system & that did not need to be placed again. She did not have a preference as to who. Due to insurance it would be no cost to use [facility], since she is employed with them.

## 2020-08-14 NOTE — Assessment & Plan Note (Signed)
Afebrile. Localized infection. Based on duration, agreed oral antibiotic appropriate. Start doxycycline, probiotics.

## 2020-08-14 NOTE — Patient Instructions (Signed)
For suspected stye, warm compresses  Start doxcycline; please wear spf clothing as discussed  Ensure to take probiotics while on antibiotics and also for 2 weeks after completion. This can either be by eating yogurt daily or taking a probiotic supplement over the counter such as Culturelle.It is important to re-colonize the gut with good bacteria and also to prevent any diarrheal infections associated with antibiotic use.   Let me know if no resolution

## 2020-08-14 NOTE — Assessment & Plan Note (Signed)
Onset today. Advised warm compresses. No role for antibiotics. Patient will let me know how she is doing

## 2020-08-15 ENCOUNTER — Ambulatory Visit: Payer: BC Managed Care – PPO | Admitting: Internal Medicine

## 2020-09-16 ENCOUNTER — Other Ambulatory Visit: Payer: Self-pay

## 2020-09-16 ENCOUNTER — Other Ambulatory Visit: Payer: Self-pay | Admitting: Family

## 2020-09-16 DIAGNOSIS — Z1211 Encounter for screening for malignant neoplasm of colon: Secondary | ICD-10-CM

## 2020-09-16 DIAGNOSIS — L03032 Cellulitis of left toe: Secondary | ICD-10-CM

## 2020-09-17 MED ORDER — METHYLPHENIDATE HCL ER 20 MG PO TBCR: 20.0000 mg | EXTENDED_RELEASE_TABLET | ORAL | 0 refills | Status: DC

## 2020-09-17 MED ORDER — DOXYCYCLINE HYCLATE 100 MG PO TABS: 100.0000 mg | ORAL_TABLET | Freq: Two times a day (BID) | ORAL | 0 refills | Status: DC

## 2020-09-17 NOTE — Telephone Encounter (Signed)
Methylphenidate refilled for 2 months.  6 month follow up  needed in June/   Colonoscopy referral request received and in process

## 2020-10-03 LAB — HM COLONOSCOPY

## 2020-11-04 ENCOUNTER — Other Ambulatory Visit: Payer: Self-pay | Admitting: Internal Medicine

## 2020-11-04 NOTE — Telephone Encounter (Signed)
RX Refill:fioricet Last Seen:05-29-20 Last ordered:08-18-19

## 2020-11-27 ENCOUNTER — Other Ambulatory Visit: Payer: Self-pay

## 2020-11-27 ENCOUNTER — Encounter: Payer: Self-pay | Admitting: Internal Medicine

## 2020-11-27 ENCOUNTER — Ambulatory Visit: Payer: BC Managed Care – PPO | Admitting: Internal Medicine

## 2020-11-27 VITALS — BP 100/72 | HR 91 | Temp 96.3°F | Resp 14 | Ht 62.0 in | Wt 132.0 lb

## 2020-11-27 DIAGNOSIS — L989 Disorder of the skin and subcutaneous tissue, unspecified: Secondary | ICD-10-CM | POA: Diagnosis not present

## 2020-11-27 DIAGNOSIS — E041 Nontoxic single thyroid nodule: Secondary | ICD-10-CM | POA: Diagnosis not present

## 2020-11-27 DIAGNOSIS — E042 Nontoxic multinodular goiter: Secondary | ICD-10-CM

## 2020-11-27 DIAGNOSIS — F988 Other specified behavioral and emotional disorders with onset usually occurring in childhood and adolescence: Secondary | ICD-10-CM | POA: Diagnosis not present

## 2020-11-27 DIAGNOSIS — M19041 Primary osteoarthritis, right hand: Secondary | ICD-10-CM

## 2020-11-27 MED ORDER — METHYLPHENIDATE HCL ER 20 MG PO TBCR: 20.0000 mg | EXTENDED_RELEASE_TABLET | ORAL | 0 refills | Status: DC

## 2020-11-27 NOTE — Progress Notes (Signed)
Subjective:  Patient ID: Nicole Camacho, female    DOB: 1970/07/24  Age: 50 y.o. MRN: [id number]  CC: The primary encounter diagnosis was Thyroid nodule. Diagnoses of Attention deficit disorder of adult, Skin lesion of scalp, Osteoarthritis of right index finger, and Multinodular goiter (nontoxic) were also pertinent to this visit.  HPI Nicole Camacho presents for follow up on multiple issues   1) history of neck swelling,  thyroid nodules:  requesting referral  to Dr Colin Benton at [facility]  2) ( OA index finger and  Thumb of right hand.  Has developed heberden's nodes   3) small red scaly patch in scalp. Uses a flat iron/curling iron but no history of  burn.  Has a grandchild  In daycare.  "cradlecap  4) precancerous skin lesions on face:  recently prescribed Imiqimod treatment t.  Quit it after 3 weeks due to pain and discomfort   5) ADD follow up.  Symptoms controlled on current therapy.  Focusing well.  Takes a drug holiday on weekends.   6)Left thigh fat deposit:  reviewed prior MRI femur negative for lipoma.     Outpatient Medications Prior to Visit  Medication Sig Dispense Refill   ALPRAZolam (XANAX) 0.25 MG tablet Take 1 tablet (0.25 mg total) by mouth at bedtime as needed. for sleep 30 tablet 5   butalbital-acetaminophen-caffeine (FIORICET) 50-325-40 MG tablet TAKE 1-2 TABLETS BY MOUTH EVERY DAY AS NEEDED FOR HEADACHE 48 tablet 2   cyclobenzaprine (FLEXERIL) 10 MG tablet Take 1 tablet (10 mg total) by mouth 3 (three) times daily as needed for muscle spasms. 30 tablet 11   doxycycline (VIBRA-TABS) 100 MG tablet Take 1 tablet (100 mg total) by mouth 2 (two) times daily. 10 tablet 0   nystatin cream (MYCOSTATIN) Apply 1 application topically 2 (two) times daily. Apply beneath breasts - to affected area 30 g 0   traZODone (DESYREL) 50 MG tablet TAKE 1/2 TO 1 TABLET BY MOUTH AT BEDTIME AS NEEDED FOR SLEEP 90 tablet 2   methylphenidate (METADATE ER) 20 MG ER tablet Take 1  tablet (20 mg total) by mouth every morning. 30 tablet 0   methylphenidate (METADATE ER) 20 MG ER tablet Take 1 tablet (20 mg total) by mouth every morning. 30 tablet 0   methylphenidate (METADATE ER) 20 MG ER tablet Take 1 tablet (20 mg total) by mouth every morning. 30 tablet 0   No facility-administered medications prior to visit.    Review of Systems;  Patient denies headache, fevers, malaise, unintentional weight loss, eye pain, sinus congestion and sinus pain, sore throat, dysphagia,  hemoptysis , cough, dyspnea, wheezing, chest pain, palpitations, orthopnea, edema, abdominal pain, nausea, melena, diarrhea, constipation, flank pain, dysuria, hematuria, urinary  Frequency, nocturia, numbness, tingling, seizures,  Focal weakness, Loss of consciousness,  Tremor, insomnia, depression, anxiety, and suicidal ideation.      Objective:  BP 100/72 (BP Location: Left Arm, Patient Position: Sitting, Cuff Size: Normal)   Pulse 91   Temp (!) 96.3 F (35.7 C) (Temporal)   Resp 14   Ht 5\' 2"  (1.575 m)   Wt 132 lb (59.9 kg)   SpO2 99%   BMI 24.14 kg/m   BP Readings from Last 3 Encounters:  11/27/20 100/72  08/14/20 102/68  05/29/20 106/62    Wt Readings from Last 3 Encounters:  11/27/20 132 lb (59.9 kg)  08/14/20 131 lb 3.2 oz (59.5 kg)  05/29/20 130 lb 3.2 oz (59.1 kg)    General  appearance: alert, cooperative and appears stated age Ears: normal TM's and external ear canals both ears Scalp:  dime sized erythematous macule with scaling border Throat: lips, mucosa, and tongue normal; teeth and gums normal Neck: no adenopathy, no carotid bruit, supple, symmetrical, trachea midline and thyroid appears enlarged without palpable nodules Back: symmetric, no curvature. ROM normal. No CVA tenderness. Lungs: clear to auscultation bilaterally Heart: regular rate and rhythm, S1, S2 normal, no murmur, click, rub or gallop Abdomen: soft, non-tender; bowel sounds normal; no masses,  no  organomegaly Pulses: 2+ and symmetric Skin: Skin color, texture, turgor normal. No rashes or lesions MSK:  Heberden's nodes rihgt index finger  Lymph nodes: Cervical, supraclavicular, and axillary nodes normal.  Lab Results  Component Value Date   HGBA1C 5.2 05/18/2019    Lab Results  Component Value Date   CREATININE 0.72 05/29/2020   CREATININE 0.71 05/18/2019   CREATININE 0.74 05/20/2018    Lab Results  Component Value Date   WBC 4.8 05/29/2020   HGB 13.4 05/29/2020   HCT 40.5 05/29/2020   PLT 243 05/29/2020   GLUCOSE 80 05/29/2020   CHOL 204 (H) 05/29/2020   TRIG 45 05/29/2020   HDL 91 05/29/2020   LDLCALC 105 (H) 05/29/2020   ALT 18 05/29/2020   AST 21 05/29/2020   NA 139 05/29/2020   K 4.0 05/29/2020   CL 101 05/29/2020   CREATININE 0.72 05/29/2020   BUN 12 05/29/2020   CO2 25 05/29/2020   TSH 0.768 05/29/2020   HGBA1C 5.2 05/18/2019    MR FEMUR LEFT W WO CONTRAST  Result Date: 03/18/2020 CLINICAL DATA:  Soft tissue mass anterior left thigh. EXAM: MR OF THE LEFT LOWER EXTREMITY WITHOUT AND WITH CONTRAST TECHNIQUE: Multiplanar, multisequence MR imaging of the left thigh was performed both before and after administration of intravenous contrast. CONTRAST:  81mL GADAVIST GADOBUTROL 1 MMOL/ML IV SOLN COMPARISON:  None. FINDINGS: The hip and knee joints are maintained. Both femurs are normal. Normal marrow signal. No bone lesion. The thigh musculature appears normal and symmetric bilaterally. No muscle mass or myositis. No subcutaneous lesions are identified. Prominent subcutaneous fat but no discrete lipoma. No areas of abnormal contrast enhancement are identified. IMPRESSION: Unremarkable MR examination of the left thigh. No bone, muscle or subcutaneous mass is identified. Electronically Signed   By: Marijo Sanes M.D.   On: 03/18/2020 08:30    Assessment & Plan:   Problem List Items Addressed This Visit       Unprioritized   Attention deficit disorder of adult     Tolerating Metadate  XR without side effects.   3 months of refills sent to pharmacy           Multinodular goiter (nontoxic)    Endocrinology recommended annual follow up due now,  But She is requesting a second opinion  from Dr Kingston Callas at [facility].  Referral in process        Osteoarthritis of right index finger    activity modification advised .  Topical voltaren trial encouraged        Skin lesion of scalp    unclear if cradle cap or tinea capitis.   Treat for cradle cap with emollients.  If spreads,  Will rx ketoconazole       Other Visit Diagnoses     Thyroid nodule    -  Primary   Relevant Orders   Ambulatory referral to ENT       I am having Sharyn Lull  Teena Dunk maintain her cyclobenzaprine, nystatin cream, ALPRAZolam, traZODone, doxycycline, butalbital-acetaminophen-caffeine, methylphenidate, methylphenidate, and methylphenidate.  Meds ordered this encounter  Medications   methylphenidate (METADATE ER) 20 MG ER tablet    Sig: Take 1 tablet (20 mg total) by mouth every morning.    Dispense:  30 tablet    Refill:  0   methylphenidate (METADATE ER) 20 MG ER tablet    Sig: Take 1 tablet (20 mg total) by mouth every morning.    Dispense:  30 tablet    Refill:  0   methylphenidate (METADATE ER) 20 MG ER tablet    Sig: Take 1 tablet (20 mg total) by mouth every morning.    Dispense:  30 tablet    Refill:  0    Medications Discontinued During This Encounter  Medication Reason   methylphenidate (METADATE ER) 20 MG ER tablet Reorder   methylphenidate (METADATE ER) 20 MG ER tablet Reorder   methylphenidate (METADATE ER) 20 MG ER tablet Reorder    I provided  30 minutes of  face-to-face time during this encounter reviewing patient's  ADD symptoms and management,,  joint pain,  scalp lesion , recent imaging studies, providing counseling on the above mentioned problems , and coordination  of care .   Follow-up: No follow-ups on file.   Crecencio Mc, MD

## 2020-11-27 NOTE — Patient Instructions (Addendum)
10 Day Green Smoothie  Diet:  great elimination  diet   The spot on your scalp maybe due to a ringworm infection (tinea capitis ) , so if it spreads, let me know   You have OA .  Your thumb may be getting DJD  I'll make the [facility] referral

## 2020-11-30 DIAGNOSIS — L989 Disorder of the skin and subcutaneous tissue, unspecified: Secondary | ICD-10-CM | POA: Insufficient documentation

## 2020-11-30 DIAGNOSIS — M19041 Primary osteoarthritis, right hand: Secondary | ICD-10-CM | POA: Insufficient documentation

## 2020-11-30 NOTE — Assessment & Plan Note (Signed)
activity modification advised .  Topical voltaren trial encouraged

## 2020-11-30 NOTE — Assessment & Plan Note (Signed)
Endocrinology recommended annual follow up due now,  But She is requesting a second opinion  from Dr Erie Callas at [facility].  Referral in process

## 2020-11-30 NOTE — Assessment & Plan Note (Signed)
unclear if cradle cap or tinea capitis.   Treat for cradle cap with emollients.  If spreads,  Will rx ketoconazole

## 2020-11-30 NOTE — Assessment & Plan Note (Signed)
Tolerating Metadate  XR without side effects.   3 months of refills sent to pharmacy

## 2021-01-29 ENCOUNTER — Other Ambulatory Visit: Payer: Self-pay | Admitting: Internal Medicine

## 2021-04-07 ENCOUNTER — Other Ambulatory Visit: Payer: Self-pay

## 2021-04-07 NOTE — Telephone Encounter (Signed)
RX Refill: METADATE ER Last Seen: 11-27-20 Last Ordered: 01-26-21 Next Appt: 05-29-21

## 2021-04-08 MED ORDER — METHYLPHENIDATE HCL ER 20 MG PO TBCR: 20.0000 mg | EXTENDED_RELEASE_TABLET | ORAL | 0 refills | Status: DC

## 2021-05-14 ENCOUNTER — Other Ambulatory Visit: Payer: Self-pay | Admitting: Internal Medicine

## 2021-05-14 ENCOUNTER — Encounter: Payer: Self-pay | Admitting: Emergency Medicine

## 2021-05-14 ENCOUNTER — Ambulatory Visit
Admission: EM | Admit: 2021-05-14 | Discharge: 2021-05-14 | Disposition: A | Discharge status: Home / Self Care | Payer: BC Managed Care – PPO | Attending: Medical Oncology | Admitting: Medical Oncology

## 2021-05-14 DIAGNOSIS — R8290 Unspecified abnormal findings in urine: Secondary | ICD-10-CM

## 2021-05-14 DIAGNOSIS — R300 Dysuria: Secondary | ICD-10-CM | POA: Diagnosis not present

## 2021-05-14 LAB — POCT URINALYSIS DIP (MANUAL ENTRY)
Bilirubin, UA: NEGATIVE
Glucose, UA: NEGATIVE mg/dL
Ketones, POC UA: NEGATIVE mg/dL
Nitrite, UA: NEGATIVE
Protein Ur, POC: 30 mg/dL — AB
Spec Grav, UA: 1.025 (ref 1.010–1.025)
Urobilinogen, UA: 0.2 E.U./dL
pH, UA: 6.5 (ref 5.0–8.0)

## 2021-05-14 MED ORDER — METHYLPHENIDATE HCL ER 20 MG PO TBCR: 20.0000 mg | EXTENDED_RELEASE_TABLET | ORAL | 0 refills | Status: DC

## 2021-05-14 MED ORDER — NITROFURANTOIN MONOHYD MACRO 100 MG PO CAPS: 100.0000 mg | ORAL_CAPSULE | Freq: Two times a day (BID) | ORAL | 0 refills | Status: DC
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
## 2021-05-14 NOTE — Telephone Encounter (Signed)
Refilled: 04/08/2021 Last OV: 11/27/2020 Next OV: 05/29/2021

## 2021-05-14 NOTE — ED Triage Notes (Signed)
Pt here with increased urgency and suprapubic pressure since yesterday.

## 2021-05-15 LAB — URINE CULTURE
Culture: NO GROWTH
Special Requests: NORMAL

## 2021-05-27 ENCOUNTER — Encounter: Payer: Self-pay | Admitting: Internal Medicine

## 2021-05-27 ENCOUNTER — Other Ambulatory Visit (INDEPENDENT_AMBULATORY_CARE_PROVIDER_SITE_OTHER): Payer: BC Managed Care – PPO

## 2021-05-27 ENCOUNTER — Other Ambulatory Visit: Payer: Self-pay

## 2021-05-27 DIAGNOSIS — R300 Dysuria: Secondary | ICD-10-CM

## 2021-05-28 LAB — URINE CULTURE
MICRO NUMBER:: 12799612
SPECIMEN QUALITY:: ADEQUATE

## 2021-05-28 LAB — URINALYSIS, ROUTINE W REFLEX MICROSCOPIC
Bilirubin Urine: NEGATIVE
Hgb urine dipstick: NEGATIVE
Ketones, ur: NEGATIVE
Leukocytes,Ua: NEGATIVE
Nitrite: NEGATIVE
RBC / HPF: NONE SEEN (ref 0–?)
Specific Gravity, Urine: 1.02 (ref 1.000–1.030)
Total Protein, Urine: NEGATIVE
Urine Glucose: NEGATIVE
Urobilinogen, UA: 0.2 (ref 0.0–1.0)
pH: 6 (ref 5.0–8.0)

## 2021-05-29 ENCOUNTER — Telehealth (INDEPENDENT_AMBULATORY_CARE_PROVIDER_SITE_OTHER): Payer: BC Managed Care – PPO | Admitting: Internal Medicine

## 2021-05-29 ENCOUNTER — Encounter: Payer: Self-pay | Admitting: Internal Medicine

## 2021-05-29 VITALS — Ht 62.0 in | Wt 129.8 lb

## 2021-05-29 DIAGNOSIS — Z739 Problem related to life management difficulty, unspecified: Secondary | ICD-10-CM | POA: Insufficient documentation

## 2021-05-29 DIAGNOSIS — R310 Gross hematuria: Secondary | ICD-10-CM | POA: Insufficient documentation

## 2021-05-29 DIAGNOSIS — R7301 Impaired fasting glucose: Secondary | ICD-10-CM | POA: Diagnosis not present

## 2021-05-29 DIAGNOSIS — Z1231 Encounter for screening mammogram for malignant neoplasm of breast: Secondary | ICD-10-CM

## 2021-05-29 DIAGNOSIS — F988 Other specified behavioral and emotional disorders with onset usually occurring in childhood and adolescence: Secondary | ICD-10-CM

## 2021-05-29 DIAGNOSIS — Z780 Asymptomatic menopausal state: Secondary | ICD-10-CM

## 2021-05-29 DIAGNOSIS — E785 Hyperlipidemia, unspecified: Secondary | ICD-10-CM | POA: Diagnosis not present

## 2021-05-29 DIAGNOSIS — R5383 Other fatigue: Secondary | ICD-10-CM

## 2021-05-29 HISTORY — DX: Gross hematuria: R31.0

## 2021-05-29 MED ORDER — METHYLPHENIDATE HCL ER 20 MG PO TBCR: 20.0000 mg | EXTENDED_RELEASE_TABLET | ORAL | 0 refills | Status: DC

## 2021-05-29 MED ORDER — CIPROFLOXACIN HCL 250 MG PO TABS: 250.0000 mg | ORAL_TABLET | Freq: Two times a day (BID) | ORAL | 0 refills | Status: AC

## 2021-05-29 NOTE — Assessment & Plan Note (Signed)
Tolerating Metadate  XR without side effects.   3 months of refills sent to pharmacy

## 2021-05-29 NOTE — Assessment & Plan Note (Signed)
counselling given.

## 2021-05-29 NOTE — Assessment & Plan Note (Signed)
UTI ruled out x 2.  Given the feeling of suprpubic fullness and incontinence, need to rule out ovarian mass,  Stones.  CT ordered
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
## 2021-05-30 LAB — COMPREHENSIVE METABOLIC PANEL
ALT: 16 IU/L (ref 0–32)
AST: 19 IU/L (ref 0–40)
Albumin/Globulin Ratio: 2.3 — ABNORMAL HIGH (ref 1.2–2.2)
Albumin: 4.8 g/dL (ref 3.8–4.9)
Alkaline Phosphatase: 41 IU/L — ABNORMAL LOW (ref 44–121)
BUN/Creatinine Ratio: 17 (ref 9–23)
BUN: 13 mg/dL (ref 6–24)
Bilirubin Total: 0.5 mg/dL (ref 0.0–1.2)
CO2: 26 mmol/L (ref 20–29)
Calcium: 9.9 mg/dL (ref 8.7–10.2)
Chloride: 102 mmol/L (ref 96–106)
Creatinine, Ser: 0.77 mg/dL (ref 0.57–1.00)
Globulin, Total: 2.1 g/dL (ref 1.5–4.5)
Glucose: 95 mg/dL (ref 70–99)
Potassium: 4.3 mmol/L (ref 3.5–5.2)
Sodium: 143 mmol/L (ref 134–144)
Total Protein: 6.9 g/dL (ref 6.0–8.5)
eGFR: 93 mL/min/{1.73_m2} (ref >59)

## 2021-05-30 LAB — FOLLICLE STIMULATING HORMONE: FSH: 110 m[IU]/mL

## 2021-05-30 LAB — CBC WITH DIFFERENTIAL/PLATELET
Basophils Absolute: 0 10*3/uL (ref 0.0–0.2)
Basos: 0 %
EOS (ABSOLUTE): 0.1 10*3/uL (ref 0.0–0.4)
Eos: 2 %
Hematocrit: 43.3 % (ref 34.0–46.6)
Hemoglobin: 14.7 g/dL (ref 11.1–15.9)
Immature Grans (Abs): 0 10*3/uL (ref 0.0–0.1)
Immature Granulocytes: 0 %
Lymphocytes Absolute: 1.1 10*3/uL (ref 0.7–3.1)
Lymphs: 21 %
MCH: 30.7 pg (ref 26.6–33.0)
MCHC: 33.9 g/dL (ref 31.5–35.7)
MCV: 90 fL (ref 79–97)
Monocytes Absolute: 0.5 10*3/uL (ref 0.1–0.9)
Monocytes: 10 %
Neutrophils Absolute: 3.5 10*3/uL (ref 1.4–7.0)
Neutrophils: 67 %
Platelets: 210 10*3/uL (ref 150–450)
RBC: 4.79 x10E6/uL (ref 3.77–5.28)
RDW: 12.2 % (ref 11.7–15.4)
WBC: 5.2 10*3/uL (ref 3.4–10.8)

## 2021-05-30 LAB — HEMOGLOBIN A1C
Est. average glucose Bld gHb Est-mCnc: 103 mg/dL
Hgb A1c MFr Bld: 5.2 % (ref 4.8–5.6)

## 2021-05-30 LAB — LIPID PANEL
Chol/HDL Ratio: 2.5 ratio (ref 0.0–4.4)
Cholesterol, Total: 230 mg/dL — ABNORMAL HIGH (ref 100–199)
HDL: 93 mg/dL (ref >39)
LDL Chol Calc (NIH): 128 mg/dL — ABNORMAL HIGH (ref 0–99)
Triglycerides: 55 mg/dL (ref 0–149)
VLDL Cholesterol Cal: 9 mg/dL (ref 5–40)

## 2021-05-30 LAB — TSH: TSH: 0.96 u[IU]/mL (ref 0.450–4.500)

## 2021-05-30 LAB — LUTEINIZING HORMONE: LH: 58.9 m[IU]/mL

## 2021-06-11 ENCOUNTER — Encounter: Payer: Self-pay | Admitting: Internal Medicine

## 2021-06-11 DIAGNOSIS — R9341 Abnormal radiologic findings on diagnostic imaging of renal pelvis, ureter, or bladder: Secondary | ICD-10-CM

## 2021-06-11 DIAGNOSIS — R310 Gross hematuria: Secondary | ICD-10-CM

## 2021-06-12 NOTE — Telephone Encounter (Signed)
Patient would like a call back about her CT Scan. She doesn't understand the results.

## 2021-06-13 NOTE — Telephone Encounter (Signed)
Pt called about ct scan and she is following up

## 2021-10-09 ENCOUNTER — Other Ambulatory Visit: Payer: Self-pay | Admitting: Internal Medicine

## 2021-10-09 MED ORDER — METHYLPHENIDATE HCL ER 20 MG PO TBCR: 20.0000 mg | EXTENDED_RELEASE_TABLET | ORAL | 0 refills | Status: DC

## 2021-10-15 ENCOUNTER — Telehealth: Payer: Self-pay

## 2021-10-15 NOTE — Telephone Encounter (Signed)
PA for Methylphenidate ER has been submitted on covermymeds.  ?

## 2021-10-17 NOTE — Telephone Encounter (Signed)
PA has been approved through 10/15/2024.

## 2021-10-28 LAB — HM MAMMOGRAPHY

## 2021-11-18 ENCOUNTER — Other Ambulatory Visit: Payer: Self-pay | Admitting: Internal Medicine

## 2021-11-19 MED ORDER — METHYLPHENIDATE HCL ER 20 MG PO TBCR: 20.0000 mg | EXTENDED_RELEASE_TABLET | ORAL | 0 refills | Status: DC

## 2021-11-27 ENCOUNTER — Encounter: Payer: Self-pay | Admitting: Internal Medicine

## 2021-11-27 ENCOUNTER — Ambulatory Visit (INDEPENDENT_AMBULATORY_CARE_PROVIDER_SITE_OTHER): Payer: BC Managed Care – PPO | Admitting: Internal Medicine

## 2021-11-27 ENCOUNTER — Other Ambulatory Visit: Payer: Self-pay | Admitting: Internal Medicine

## 2021-11-27 VITALS — BP 118/80 | HR 80 | Temp 98.3°F | Ht 62.0 in | Wt 131.4 lb

## 2021-11-27 DIAGNOSIS — E785 Hyperlipidemia, unspecified: Secondary | ICD-10-CM

## 2021-11-27 DIAGNOSIS — Z90710 Acquired absence of both cervix and uterus: Secondary | ICD-10-CM

## 2021-11-27 DIAGNOSIS — Z124 Encounter for screening for malignant neoplasm of cervix: Secondary | ICD-10-CM

## 2021-11-27 DIAGNOSIS — E042 Nontoxic multinodular goiter: Secondary | ICD-10-CM

## 2021-11-27 DIAGNOSIS — N134 Hydroureter: Secondary | ICD-10-CM

## 2021-11-27 DIAGNOSIS — R5383 Other fatigue: Secondary | ICD-10-CM | POA: Diagnosis not present

## 2021-11-27 DIAGNOSIS — R7301 Impaired fasting glucose: Secondary | ICD-10-CM

## 2021-11-27 DIAGNOSIS — R310 Gross hematuria: Secondary | ICD-10-CM

## 2021-11-27 DIAGNOSIS — Z9079 Acquired absence of other genital organ(s): Secondary | ICD-10-CM

## 2021-11-27 DIAGNOSIS — Z90721 Acquired absence of ovaries, unilateral: Secondary | ICD-10-CM

## 2021-11-27 DIAGNOSIS — Z0000 Encounter for general adult medical examination without abnormal findings: Secondary | ICD-10-CM | POA: Diagnosis not present

## 2021-11-27 DIAGNOSIS — E041 Nontoxic single thyroid nodule: Secondary | ICD-10-CM

## 2021-11-27 NOTE — Patient Instructions (Signed)
I recommend repeating the thyroid ultrasound  I will talk to Dr Irene Limbo about the best way to follow up on the left ureteral changes

## 2021-11-27 NOTE — Assessment & Plan Note (Signed)
She has no cervix,  But has requested pelvic and PAP smear today  Which was done

## 2021-11-27 NOTE — Assessment & Plan Note (Signed)

## 2021-11-27 NOTE — Assessment & Plan Note (Signed)
Presumed secondary to recent nephrolithiasis based on negative urine cultures,  Calcium oxalate crystals in urine,   and CT  In December 2022 noting left ureteral dilation without hydronephrosis .  She has not had a repeat CT scan or urologic evaluation since her symptoms have resolved.

## 2021-11-27 NOTE — Progress Notes (Signed)
The patient is here for annual preventive examination and management of other chronic and acute problems.   The risk factors are reflected in the social history.   The roster of all physicians providing medical care to patient - is listed in the Snapshot section of the chart.   Activities of daily living:  The patient is 100% independent in all ADLs: dressing, toileting, feeding as well as independent mobility   Home safety : The patient has smoke detectors in the home. They wear seatbelts.  There are no unsecured firearms at home. There is no violence in the home.    There is no risks for hepatitis, STDs or HIV. There is no   history of blood transfusion. They have no travel history to infectious disease endemic areas of the world.   The patient has seen their dentist in the last six month. They have seen their eye doctor in the last year. The patient  denies slight hearing difficulty with regard to whispered voices and some television programs.  They have deferred audiologic testing in the last year.  They do not  have excessive sun exposure. Discussed the need for sun protection: hats, long sleeves and use of sunscreen if there is significant sun exposure.    Diet: the importance of a healthy diet is discussed. They do have a healthy diet.   The benefits of regular aerobic exercise were discussed. The patient  exercises  3 to 5 days per week  for  60 minutes.    Depression screen: there are no signs or vegative symptoms of depression- irritability, change in appetite, anhedonia, sadness/tearfullness.   The following portions of the patient's history were reviewed and updated as appropriate: allergies, current medications, past family history, past medical history,  past surgical history, past social history  and problem list.   Visual acuity was not assessed per patient preference since the patient has regular follow up with an  ophthalmologist. Hearing and body mass index were assessed and  reviewed.    During the course of the visit the patient was educated and counseled about appropriate screening and preventive services including : fall prevention , diabetes screening, nutrition counseling, colorectal cancer screening, and recommended immunizations.    Chief Complaint:  2 new grandchildren born in May to different sons.  Right  index trigger finger injected in October,  injected again last month with  less excellent results by [facility] .  No surgery planned unless injections begin to fail   History of TAH and LSO with menorrhagia.  No pap in 10 years .  Wants one today  Kidney stone in late December  (Presumed). No recurrence of symptoms.  Reviewed Dec 2022 CT scan   Increased neck size (perceived by patient):  reviewed history of MNG last u/s 2021 with Endocrine consult.   Review of Symptoms  Patient denies headache, fevers, malaise, unintentional weight loss, skin rash, eye pain, sinus congestion and sinus pain, sore throat, dysphagia,  hemoptysis , cough, dyspnea, wheezing, chest pain, palpitations, orthopnea, edema, abdominal pain, nausea, melena, diarrhea, constipation, flank pain, dysuria, hematuria, urinary  Frequency, nocturia, numbness, tingling, seizures,  Focal weakness, Loss of consciousness,  Tremor, insomnia, depression, anxiety, and suicidal ideation.    Physical Exam:  BP 118/80 (BP Location: Left Arm, Patient Position: Sitting, Cuff Size: Normal)   Pulse 80   Temp 98.3 F (36.8 C) (Oral)   Ht 5\' 2"  (1.575 m)   Wt 131 lb 6.4 oz (59.6 kg)  SpO2 98%   BMI 24.03 kg/m      Assessment and Plan:  S/P abdominal hysterectomy and left salpingo-oophorectomy She has no cervix,  But has requested pelvic and PAP smear today  Which was done   Hematuria, gross Presumed secondary to recent nephrolithiasis based on negative urine cultures,  Calcium oxalate crystals in urine,   and CT  In December 2022 noting left ureteral dilation without hydronephrosis .   She has not had a repeat CT scan or urologic evaluation since her symptoms have resolved.   Multinodular goiter (nontoxic) Last thyroid US was in June 2021, no nodules met criteria for biopsy or follow up,  And endocrinology deferred aspirate and nuc med study.  However,  patient feels that her neck has enlarged, and she has slight edema f left side of face.  Repeating thyroid ultrasound.   Encounter for annual physical exam age appropriate education and counseling updated, referrals for preventative services and immunizations addressed, dietary and smoking counseling addressed, most recent labs reviewed.  I have personally reviewed and have noted:   1) the patient's medical and social history 2) The pt's use of alcohol, tobacco, and illicit drugs 3) The patient's current medications and supplements 4) Functional ability including ADL's, fall risk, home safety risk, hearing and visual impairment 5) Diet and physical activities 6) Evidence for depression or mood disorder 7) The patient's height, weight, and BMI have been recorded in the chart   I have made referrals, and provided counseling and education based on review of the above  Updated Medication List Outpatient Encounter Medications as of 11/27/2021  Medication Sig   ALPRAZolam (XANAX) 0.25 MG tablet Take 1 tablet (0.25 mg total) by mouth at bedtime as needed. for sleep   butalbital-acetaminophen-caffeine (FIORICET) 50-325-40 MG tablet TAKE 1-2 TABLETS BY MOUTH EVERY DAY AS NEEDED FOR HEADACHE   cyclobenzaprine (FLEXERIL) 10 MG tablet Take 1 tablet (10 mg total) by mouth 3 (three) times daily as needed for muscle spasms.   methylphenidate (METADATE ER) 20 MG ER tablet Take 1 tablet (20 mg total) by mouth every morning.   methylphenidate (METADATE ER) 20 MG ER tablet Take 1 tablet (20 mg total) by mouth every morning.   methylphenidate (METADATE ER) 20 MG ER tablet Take 1 tablet (20 mg total) by mouth every morning.   nystatin cream  (MYCOSTATIN) Apply 1 application topically 2 (two) times daily. Apply beneath breasts - to affected area   traZODone (DESYREL) 50 MG tablet TAKE 1/2 TO 1 TABLET BY MOUTH AT BEDTIME AS NEEDED FOR SLEEP   No facility-administered encounter medications on file as of 11/27/2021.

## 2021-11-27 NOTE — Assessment & Plan Note (Signed)
Last thyroid US was in June 2021, no nodules met criteria for biopsy or follow up,  And endocrinology deferred aspirate and nuc med study.  However,  patient feels that her neck has enlarged, and she has slight edema f left side of face.  Repeating thyroid ultrasound.

## 2021-11-28 LAB — COMPREHENSIVE METABOLIC PANEL
ALT: 22 IU/L (ref 0–32)
AST: 19 IU/L (ref 0–40)
Albumin/Globulin Ratio: 2 (ref 1.2–2.2)
Albumin: 4.7 g/dL (ref 3.8–4.9)
Alkaline Phosphatase: 41 IU/L — ABNORMAL LOW (ref 44–121)
BUN/Creatinine Ratio: 22 (ref 9–23)
BUN: 18 mg/dL (ref 6–24)
Bilirubin Total: 0.4 mg/dL (ref 0.0–1.2)
CO2: 24 mmol/L (ref 20–29)
Calcium: 10 mg/dL (ref 8.7–10.2)
Chloride: 101 mmol/L (ref 96–106)
Creatinine, Ser: 0.81 mg/dL (ref 0.57–1.00)
Globulin, Total: 2.4 g/dL (ref 1.5–4.5)
Glucose: 97 mg/dL (ref 70–99)
Potassium: 4.2 mmol/L (ref 3.5–5.2)
Sodium: 143 mmol/L (ref 134–144)
Total Protein: 7.1 g/dL (ref 6.0–8.5)
eGFR: 88 mL/min/{1.73_m2} (ref >59)

## 2021-11-28 LAB — LIPID PANEL
Chol/HDL Ratio: 2.4 ratio (ref 0.0–4.4)
Cholesterol, Total: 244 mg/dL — ABNORMAL HIGH (ref 100–199)
HDL: 101 mg/dL (ref >39)
LDL Chol Calc (NIH): 135 mg/dL — ABNORMAL HIGH (ref 0–99)
Triglycerides: 51 mg/dL (ref 0–149)
VLDL Cholesterol Cal: 8 mg/dL (ref 5–40)

## 2021-11-28 LAB — CBC WITH DIFFERENTIAL/PLATELET
Basophils Absolute: 0 10*3/uL (ref 0.0–0.2)
Basos: 1 %
EOS (ABSOLUTE): 0.1 10*3/uL (ref 0.0–0.4)
Eos: 3 %
Hematocrit: 44.2 % (ref 34.0–46.6)
Hemoglobin: 14.7 g/dL (ref 11.1–15.9)
Immature Grans (Abs): 0 10*3/uL (ref 0.0–0.1)
Immature Granulocytes: 0 %
Lymphocytes Absolute: 1.4 10*3/uL (ref 0.7–3.1)
Lymphs: 32 %
MCH: 30.1 pg (ref 26.6–33.0)
MCHC: 33.3 g/dL (ref 31.5–35.7)
MCV: 90 fL (ref 79–97)
Monocytes Absolute: 0.5 10*3/uL (ref 0.1–0.9)
Monocytes: 10 %
Neutrophils Absolute: 2.4 10*3/uL (ref 1.4–7.0)
Neutrophils: 54 %
Platelets: 203 10*3/uL (ref 150–450)
RBC: 4.89 x10E6/uL (ref 3.77–5.28)
RDW: 12.2 % (ref 11.7–15.4)
WBC: 4.5 10*3/uL (ref 3.4–10.8)

## 2021-11-28 LAB — TSH: TSH: 1.01 u[IU]/mL (ref 0.450–4.500)

## 2021-11-28 LAB — LDL CHOLESTEROL, DIRECT: LDL Direct: 130 mg/dL — ABNORMAL HIGH (ref 0–99)

## 2021-11-28 LAB — HEMOGLOBIN A1C
Est. average glucose Bld gHb Est-mCnc: 111 mg/dL
Hgb A1c MFr Bld: 5.5 % (ref 4.8–5.6)

## 2021-12-03 LAB — PAP LB (LIQUID-BASED)

## 2021-12-26 ENCOUNTER — Other Ambulatory Visit: Payer: Self-pay | Admitting: Internal Medicine

## 2021-12-28 MED ORDER — METHYLPHENIDATE HCL ER 20 MG PO TBCR: 20.0000 mg | EXTENDED_RELEASE_TABLET | ORAL | 0 refills | Status: DC

## 2022-02-20 ENCOUNTER — Other Ambulatory Visit: Payer: Self-pay | Admitting: Internal Medicine

## 2022-02-20 MED ORDER — METHYLPHENIDATE HCL ER 20 MG PO TBCR: 20.0000 mg | EXTENDED_RELEASE_TABLET | ORAL | 0 refills | Status: DC

## 2022-04-03 ENCOUNTER — Other Ambulatory Visit: Payer: Self-pay | Admitting: Internal Medicine

## 2022-04-04 MED ORDER — METHYLPHENIDATE HCL ER 20 MG PO TBCR: 20.0000 mg | EXTENDED_RELEASE_TABLET | ORAL | 0 refills | Status: DC

## 2022-05-05 ENCOUNTER — Encounter: Payer: Self-pay | Admitting: Internal Medicine

## 2022-05-07 NOTE — Telephone Encounter (Signed)
Patient called to schedule appointment with Dr. Deborra Medina.  Dr. Derrel Nip does not have any available appointment times this week and only has end of the day slots next week, which are usually virtual.  Please let us know how she would like to schedule.  Patient states it's ok to go ahead and schedule if we don't get her by phone and we can leave a detailed voicemail for her with the date and time.

## 2022-05-11 ENCOUNTER — Ambulatory Visit: Payer: BC Managed Care – PPO | Admitting: Internal Medicine

## 2022-05-11 ENCOUNTER — Encounter: Payer: Self-pay | Admitting: Internal Medicine

## 2022-05-11 VITALS — BP 130/66 | HR 74 | Temp 97.7°F | Ht 62.0 in | Wt 136.0 lb

## 2022-05-11 DIAGNOSIS — N281 Cyst of kidney, acquired: Secondary | ICD-10-CM

## 2022-05-11 DIAGNOSIS — N200 Calculus of kidney: Secondary | ICD-10-CM | POA: Diagnosis not present

## 2022-05-11 DIAGNOSIS — R109 Unspecified abdominal pain: Secondary | ICD-10-CM | POA: Diagnosis not present

## 2022-05-11 DIAGNOSIS — N2889 Other specified disorders of kidney and ureter: Secondary | ICD-10-CM

## 2022-05-11 DIAGNOSIS — F988 Other specified behavioral and emotional disorders with onset usually occurring in childhood and adolescence: Secondary | ICD-10-CM

## 2022-05-11 LAB — POCT URINALYSIS DIP (CLINITEK)
Bilirubin, UA: NEGATIVE
Blood, UA: NEGATIVE
Glucose, UA: NEGATIVE mg/dL
Ketones, POC UA: NEGATIVE mg/dL
Leukocytes, UA: NEGATIVE
Nitrite, UA: NEGATIVE
POC PROTEIN,UA: NEGATIVE
Spec Grav, UA: 1.02 (ref 1.010–1.025)
Urobilinogen, UA: 0.2 E.U./dL
pH, UA: 6.5 (ref 5.0–8.0)

## 2022-05-11 MED ORDER — HYDROCODONE-ACETAMINOPHEN 10-325 MG PO TABS
1.0000 | ORAL_TABLET | Freq: Four times a day (QID) | ORAL | 0 refills | Status: DC | PRN
Start: 2022-05-11 — End: 2023-01-13

## 2022-05-11 MED ORDER — METHYLPHENIDATE HCL ER 20 MG PO TBCR: 20.0000 mg | EXTENDED_RELEASE_TABLET | ORAL | 0 refills | Status: DC

## 2022-05-11 MED ORDER — METADATE ER 20 MG PO TBCR: 20.0000 mg | EXTENDED_RELEASE_TABLET | ORAL | 0 refills | Status: DC

## 2022-05-11 MED ORDER — HYDROCODONE-ACETAMINOPHEN 10-325 MG PO TABS
1.0000 | ORAL_TABLET | Freq: Four times a day (QID) | ORAL | 0 refills | Status: DC | PRN
Start: 2022-05-11 — End: 2022-05-11

## 2022-05-11 MED ORDER — TAMSULOSIN HCL 0.4 MG PO CAPS: 0.4000 mg | ORAL_CAPSULE | Freq: Every day | ORAL | 3 refills | Status: DC

## 2022-05-11 MED ORDER — CYCLOBENZAPRINE HCL 10 MG PO TABS: 10.0000 mg | ORAL_TABLET | Freq: Three times a day (TID) | ORAL | 11 refills | Status: DC | PRN

## 2022-05-11 NOTE — Assessment & Plan Note (Signed)
She has been Tolerating Metadate  XR without side effects.   3 months of refills sent to pharmacy

## 2022-05-11 NOTE — Assessment & Plan Note (Addendum)
Ddx includes enlarging renal cyst,  nephrolithiasis,  hydroureter.    abscess and pyelonephritis unlikely given absence of fevers,  UTI.  MSK possible, but pain was not aggravated by resisted hip flexion and extension.  CT abd and pelvis with and without contrast ordered

## 2022-05-11 NOTE — Progress Notes (Signed)
Subjective:  Patient ID: Nicole Camacho, female    DOB: January 26, 1971  Age: 51 y.o. MRN: [id number]  CC: The primary encounter diagnosis was Kidney stone. Diagnoses of Acute right flank pain, Renal cyst, acquired, right, Nephrolithiasis, Other specified disorders of kidney and ureter, and Attention deficit disorder of adult were also pertinent to this visit.   HPI Nicole Camacho presents for  Chief Complaint  Patient presents with   Acute Visit    Possible kidney stone   SEVERAL WEEKS OF RIGHT SIDED CVA PAIN.  AT TIMES radiates to RUQ AS WELL  .  NO BLOOD IN STOOLS. No nausea,  dysuria ,  dull most of the time but becomes stabbing at times.  No hematuria.   She has a History of stones and a  left hydroureter  by Jan 2023  CT.  Saw urology,  had a renal ultrasound which was done in October.  Was asymptomatic at that time .    The pain is aggravated by lying on opposite side, improves with supine positioning.  Does not improve with massage.   ADHD:  refills needed on metadate.  Does not use on the weekends    Outpatient Medications Prior to Visit  Medication Sig Dispense Refill   ALPRAZolam (XANAX) 0.25 MG tablet Take 1 tablet (0.25 mg total) by mouth at bedtime as needed. for sleep 30 tablet 5   butalbital-acetaminophen-caffeine (FIORICET) 50-325-40 MG tablet TAKE 1-2 TABLETS BY MOUTH EVERY DAY AS NEEDED FOR HEADACHE 48 tablet 2   methylphenidate (METADATE ER) 20 MG ER tablet Take 1 tablet (20 mg total) by mouth every morning. 30 tablet 0   methylphenidate (METADATE ER) 20 MG ER tablet Take 1 tablet (20 mg total) by mouth every morning. 30 tablet 0   methylphenidate (METADATE ER) 20 MG ER tablet Take 1 tablet (20 mg total) by mouth every morning. 30 tablet 0   nystatin cream (MYCOSTATIN) Apply 1 application topically 2 (two) times daily. Apply beneath breasts - to affected area 30 g 0   traZODone (DESYREL) 50 MG tablet TAKE 1/2 TO 1 TABLET BY MOUTH AT BEDTIME AS NEEDED FOR SLEEP 90  tablet 2   cyclobenzaprine (FLEXERIL) 10 MG tablet Take 1 tablet (10 mg total) by mouth 3 (three) times daily as needed for muscle spasms. 30 tablet 11   No facility-administered medications prior to visit.    Review of Systems;  Patient denies headache, fevers, malaise, unintentional weight loss, skin rash, eye pain, sinus congestion and sinus pain, sore throat, dysphagia,  hemoptysis , cough, dyspnea, wheezing, chest pain, palpitations, orthopnea, edema, abdominal pain, nausea, melena, diarrhea, constipation, dysuria, hematuria, urinary  Frequency, nocturia, numbness, tingling, seizures,  Focal weakness, Loss of consciousness,  Tremor, insomnia, depression, anxiety, and suicidal ideation.      Objective:  BP 130/66   Pulse 74   Temp 97.7 F (36.5 C) (Oral)   Ht '5\' 2"'$  (1.575 m)   Wt 136 lb (61.7 kg)   SpO2 97%   BMI 24.87 kg/m   BP Readings from Last 3 Encounters:  05/11/22 130/66  11/27/21 118/80  05/14/21 117/76    Wt Readings from Last 3 Encounters:  05/11/22 136 lb (61.7 kg)  11/27/21 131 lb 6.4 oz (59.6 kg)  05/29/21 129 lb 12.8 oz (58.9 kg)    General appearance: alert, cooperative and appears stated age Ears: normal TM's and external ear canals both ears Throat: lips, mucosa, and tongue normal; teeth and gums normal Neck: no  adenopathy, no carotid bruit, supple, symmetrical, trachea midline and thyroid not enlarged, symmetric, no tenderness/mass/nodules Back: symmetric, no curvature. ROM normal. No CVA tenderness. Lungs: clear to auscultation bilaterally Heart: regular rate and rhythm, S1, S2 normal, no murmur, click, rub or gallop Abdomen: soft, non-tender; bowel sounds normal; no masses,  no organomegaly Pulses: 2+ and symmetric Skin: Skin color, texture, turgor normal. No rashes or lesions Lymph nodes: Cervical, supraclavicular, and axillary nodes normal. Neuro:  awake and interactive with normal mood and affect. Higher cortical functions are normal. Speech is  clear without word-finding difficulty or dysarthria. Extraocular movements are intact. Visual fields of both eyes are grossly intact. Sensation to light touch is grossly intact bilaterally of upper and lower extremities. Motor examination shows 4+/5 symmetric hand grip and upper extremity and 5/5 lower extremity strength. There is no pronation or drift. Gait is non-ataxic   Lab Results  Component Value Date   HGBA1C 5.5 11/27/2021   HGBA1C 5.2 05/29/2021   HGBA1C 5.2 05/18/2019    Lab Results  Component Value Date   CREATININE 0.81 11/27/2021   CREATININE 0.77 05/29/2021   CREATININE 0.72 05/29/2020    Lab Results  Component Value Date   WBC 4.5 11/27/2021   HGB 14.7 11/27/2021   HCT 44.2 11/27/2021   PLT 203 11/27/2021   GLUCOSE 97 11/27/2021   CHOL 244 (H) 11/27/2021   TRIG 51 11/27/2021   HDL 101 11/27/2021   LDLDIRECT 130 (H) 11/27/2021   LDLCALC 135 (H) 11/27/2021   ALT 22 11/27/2021   AST 19 11/27/2021   NA 143 11/27/2021   K 4.2 11/27/2021   CL 101 11/27/2021   CREATININE 0.81 11/27/2021   BUN 18 11/27/2021   CO2 24 11/27/2021   TSH 1.010 11/27/2021   HGBA1C 5.5 11/27/2021    No results found.  Assessment & Plan:   Problem List Items Addressed This Visit     Attention deficit disorder of adult    She has been Tolerating Metadate  XR without side effects.   3 months of refills sent to pharmacy          Acute right flank pain    Ddx includes enlarging renal cyst,  nephrolithiasis,  hydroureter.    abscess and pyelonephritis unlikely given absence of fevers,  UTI.  MSK possible, but pain was not aggravated by resisted hip flexion and extension.  CT abd and pelvis with and without contrast ordered       Relevant Orders   CT ABDOMEN PELVIS W WO CONTRAST   Urine Culture   Other Visit Diagnoses     Kidney stone    -  Primary   Relevant Medications   HYDROcodone-acetaminophen (NORCO) 10-325 MG tablet   Other Relevant Orders   Urine Microscopic Only    POCT URINALYSIS DIP (CLINITEK) (Completed)   Renal cyst, acquired, right       Relevant Orders   CT ABDOMEN PELVIS W WO CONTRAST   Nephrolithiasis       Relevant Medications   HYDROcodone-acetaminophen (NORCO) 10-325 MG tablet   Other Relevant Orders   CT ABDOMEN PELVIS W WO CONTRAST   Other specified disorders of kidney and ureter       Relevant Orders   CT ABDOMEN PELVIS W WO CONTRAST       I spent a total of  40  minutes with this patient in a face to face visit on the date of this encounter reviewing the last office visit with me  June,      ,  most recent visit with Urology, ,  patient's diet and exercise habits,  prior  imaging studies ,   and post visit ordering of testing and therapeutics.    Follow-up: No follow-ups on file.   Crecencio Mc, MD

## 2022-05-12 LAB — URINALYSIS, MICROSCOPIC ONLY: RBC / HPF: NONE SEEN (ref 0–?)

## 2022-05-21 ENCOUNTER — Telehealth: Payer: Self-pay

## 2022-05-21 DIAGNOSIS — R109 Unspecified abdominal pain: Secondary | ICD-10-CM

## 2022-05-21 MED ORDER — HYOSCYAMINE SULFATE 0.125 MG PO TABS: 0.1250 mg | ORAL_TABLET | ORAL | 0 refills | Status: DC | PRN

## 2022-05-21 NOTE — Addendum Note (Signed)
Addended by: Crecencio Mc on: 05/21/2022 01:23 PM   Modules accepted: Orders

## 2022-05-21 NOTE — Telephone Encounter (Signed)
Received pt's CT abdomen results from [facility]. Placed in yellow results folder.

## 2022-05-21 NOTE — Assessment & Plan Note (Signed)
CT Abd and pelvis WWO contrast done at [facility] on Dec 19.  No stones seen ,  simple renal cysts bilaterally,  mild "fullness" of proximal right ureter without suspicisous thickening .  Will try hyoscyamine for spasm  and have her follow up with Dr. Veronda Prude [facility] urology, last seen Oct 2023)

## 2022-05-29 ENCOUNTER — Ambulatory Visit: Payer: BC Managed Care – PPO | Admitting: Internal Medicine

## 2022-07-21 ENCOUNTER — Other Ambulatory Visit: Payer: Self-pay | Admitting: Internal Medicine

## 2022-07-22 MED ORDER — METHYLPHENIDATE HCL ER 20 MG PO TBCR: 20.0000 mg | EXTENDED_RELEASE_TABLET | Freq: Every day | ORAL | 0 refills | Status: DC

## 2022-07-26 ENCOUNTER — Other Ambulatory Visit: Payer: Self-pay | Admitting: Internal Medicine

## 2022-08-06 ENCOUNTER — Other Ambulatory Visit: Payer: Self-pay | Admitting: Internal Medicine

## 2022-10-08 ENCOUNTER — Other Ambulatory Visit: Payer: Self-pay | Admitting: Internal Medicine

## 2022-10-08 NOTE — Telephone Encounter (Signed)
Refilled: 11/19/2021 Last OV: 05/11/2022 Next OV: not scheduled

## 2022-12-22 ENCOUNTER — Telehealth: Payer: Self-pay | Admitting: Internal Medicine

## 2022-12-22 MED ORDER — METHYLPHENIDATE HCL ER 20 MG PO TBCR: 20.0000 mg | EXTENDED_RELEASE_TABLET | ORAL | 0 refills | Status: DC

## 2022-12-24 NOTE — Telephone Encounter (Signed)
noted 

## 2022-12-25 MED ORDER — METHYLPHENIDATE HCL ER 20 MG PO TBCR: 20.0000 mg | EXTENDED_RELEASE_TABLET | Freq: Every day | ORAL | 0 refills | Status: DC

## 2022-12-25 NOTE — Telephone Encounter (Signed)
Prescription Request  12/25/2022  LOV: 05/11/2022  What is the name of the medication or equipment? methylphenidate   Have you contacted your pharmacy to request a refill? Yes   [facility] [address]    Patient notified that their request is being sent to the clinical staff for review and that they should receive a response within 2 business days.   Please advise at Mobile [phone] (mobile)   Pt stated that the pharmacy wrote it for August 21 instead of July 21

## 2022-12-25 NOTE — Telephone Encounter (Signed)
Pt is aware and gave a verbal understanding.  

## 2022-12-25 NOTE — Telephone Encounter (Signed)
[facility] to [facility]

## 2022-12-30 ENCOUNTER — Encounter (INDEPENDENT_AMBULATORY_CARE_PROVIDER_SITE_OTHER): Payer: Self-pay

## 2023-01-13 ENCOUNTER — Encounter: Payer: Self-pay | Admitting: Internal Medicine

## 2023-01-13 ENCOUNTER — Ambulatory Visit: Payer: BC Managed Care – PPO | Admitting: Internal Medicine

## 2023-01-13 VITALS — BP 122/78 | HR 72 | Temp 97.6°F | Ht 62.0 in | Wt 136.0 lb

## 2023-01-13 DIAGNOSIS — F988 Other specified behavioral and emotional disorders with onset usually occurring in childhood and adolescence: Secondary | ICD-10-CM

## 2023-01-13 DIAGNOSIS — F5102 Adjustment insomnia: Secondary | ICD-10-CM | POA: Diagnosis not present

## 2023-01-13 DIAGNOSIS — M19041 Primary osteoarthritis, right hand: Secondary | ICD-10-CM | POA: Diagnosis not present

## 2023-01-13 DIAGNOSIS — Z739 Problem related to life management difficulty, unspecified: Secondary | ICD-10-CM | POA: Diagnosis not present

## 2023-01-13 MED ORDER — METHYLPHENIDATE HCL ER (OSM) 27 MG PO TBCR: 27.0000 mg | EXTENDED_RELEASE_TABLET | Freq: Every morning | ORAL | 0 refills | Status: DC

## 2023-01-13 MED ORDER — CYCLOBENZAPRINE HCL 10 MG PO TABS: 10.0000 mg | ORAL_TABLET | Freq: Three times a day (TID) | ORAL | 11 refills | Status: DC | PRN

## 2023-01-13 NOTE — Progress Notes (Unsigned)
Subjective:  Patient ID: Nicole Camacho, female    DOB: 24-Jun-1970  Age: 52 y.o. MRN: [id number]  CC: {There were no encounter diagnoses. (Refresh or delete this SmartLink)}   HPI Nicole Camacho presents for  Chief Complaint  Patient presents with   Medical Management of Chronic Issues    Medication refill   1) ADD:  taking Metadate 20 mg.  Concentration has been poor lately.  Wants to increase dose.   2) insomnia/anxiety: improved since working  from home except for 1 day per week .  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  No recent issues.  No CVA pain   4) trigger finger  right inde x  has had 3 steroid injections over the past 2 years.  Surgery planned /advised after March injection     Outpatient Medications Prior to Visit  Medication Sig Dispense Refill   ALPRAZolam (XANAX) 0.25 MG tablet Take 1 tablet (0.25 mg total) by mouth at bedtime as needed. for sleep 30 tablet 5   butalbital-acetaminophen-caffeine (FIORICET) 50-325-40 MG tablet TAKE 1-2 TABLETS BY MOUTH EVERY DAY AS NEEDED FOR HEADACHE 48 tablet 0   [START ON 01/21/2023] methylphenidate (METADATE ER) 20 MG ER tablet Take 1 tablet (20 mg total) by mouth every morning. 30 tablet 0   methylphenidate (METADATE ER) 20 MG ER tablet Take 1 tablet (20 mg total) by mouth daily. 30 tablet 0   nystatin cream (MYCOSTATIN) Apply 1 application topically 2 (two) times daily. Apply beneath breasts - to affected area 30 g 0   traZODone (DESYREL) 50 MG tablet TAKE 1/2 TO 1 TABLET BY MOUTH AT BEDTIME AS NEEDED FOR SLEEP 90 tablet 2   cyclobenzaprine (FLEXERIL) 10 MG tablet Take 1 tablet (10 mg total) by mouth 3 (three) times daily as needed for muscle spasms. 30 tablet 11   HYDROcodone-acetaminophen (NORCO) 10-325 MG tablet Take 1 tablet by mouth every 6 (six) hours as needed.  (Patient not taking: Reported on 01/13/2023) 30 tablet 0   hyoscyamine (LEVSIN) 0.125 MG tablet Take 1 tablet (0.125 mg total) by mouth every 4 (four) hours as needed. (Patient not taking: Reported on 01/13/2023) 30 tablet 0   tamsulosin (FLOMAX) 0.4 MG CAPS capsule TAKE 1 CAPSULE BY MOUTH EVERY DAY (Patient not taking: Reported on 01/13/2023) 90 capsule 1   No facility-administered medications prior to visit.    Review of Systems;  Patient denies headache, fevers, malaise, unintentional weight loss, skin rash, eye pain, sinus congestion and sinus pain, sore throat, dysphagia,  hemoptysis , cough, dyspnea, wheezing, chest pain, palpitations, orthopnea, edema, abdominal pain, nausea, melena, diarrhea, constipation, flank pain, dysuria, hematuria, urinary  Frequency, nocturia, numbness, tingling, seizures,  Focal weakness, Loss of consciousness,  Tremor, insomnia, depression, anxiety, and suicidal ideation.      Objective:  BP 122/78   Pulse 72   Temp 97.6 F (36.4 C) (Oral)   Ht 5\' 2"  (1.575 m)   Wt 136 lb (61.7 kg)   SpO2 97%   BMI 24.87 kg/m   BP Readings from Last 3 Encounters:  01/13/23 122/78  05/11/22 130/66  11/27/21 118/80    Wt Readings from Last 3 Encounters:  01/13/23 136 lb (61.7 kg)  05/11/22 136 lb (61.7 kg)  11/27/21 131 lb 6.4 oz (59.6 kg)    Physical Exam  Lab Results  Component Value Date   HGBA1C 5.5 11/27/2021   HGBA1C 5.2 05/29/2021   HGBA1C 5.2 05/18/2019    Lab Results  Component Value Date   CREATININE 0.81 11/27/2021   CREATININE 0.77 05/29/2021   CREATININE 0.72 05/29/2020    Lab Results  Component Value Date   WBC 4.5 11/27/2021   HGB 14.7 11/27/2021   HCT 44.2 11/27/2021   PLT 203 11/27/2021   GLUCOSE 97 11/27/2021   CHOL 244 (H) 11/27/2021   TRIG 51 11/27/2021   HDL 101 11/27/2021   LDLDIRECT 130 (H) 11/27/2021   LDLCALC 135 (H) 11/27/2021   ALT 22 11/27/2021   AST 19 11/27/2021   NA 143 11/27/2021   K 4.2 11/27/2021   CL  101 11/27/2021   CREATININE 0.81 11/27/2021   BUN 18 11/27/2021   CO2 24 11/27/2021   TSH 1.010 11/27/2021   HGBA1C 5.5 11/27/2021    No results found.  Assessment & Plan:  .There are no diagnoses linked to this encounter.   I provided 30 minutes of face-to-face time during this encounter reviewing patient's last visit with me, patient's  most recent visit with cardiology,  nephrology,  and neurology,  recent surgical and non surgical procedures, previous  labs and imaging studies, counseling on currently addressed issues,  and post visit ordering to diagnostics and therapeutics .   Follow-up: No follow-ups on file.   Sherlene Shams, MD

## 2023-01-14 ENCOUNTER — Encounter: Payer: Self-pay | Admitting: Internal Medicine

## 2023-01-14 NOTE — Assessment & Plan Note (Addendum)
Changing Metadate ER medication to increase dose to 27 mg

## 2023-01-14 NOTE — Assessment & Plan Note (Signed)
Now with recurrent trigger finger.  Considering surgery

## 2023-01-14 NOTE — Assessment & Plan Note (Signed)
Improving management based on discussion today

## 2023-01-14 NOTE — Assessment & Plan Note (Signed)
Using alprazolam very rarely,  Trazodone nightly .  The risks and benefits of benzodiazepine use were reviewed with patient today including excessive sedation leading to respiratory depression,  impaired thinking/driving, and addiction.  Patient was advised to avoid concurrent use with alcohol, to use medication only as needed and not to share with others  .

## 2023-01-20 ENCOUNTER — Other Ambulatory Visit (HOSPITAL_COMMUNITY): Payer: Self-pay

## 2023-01-20 ENCOUNTER — Telehealth: Payer: Self-pay

## 2023-01-20 NOTE — Telephone Encounter (Signed)
*  Primary  Pharmacy Patient Advocate Encounter   Received notification from CoverMyMeds that prior authorization for Methylphenidate HCl ER TBCR 27MG  tablets  is required/requested.   Insurance verification completed.   The patient is insured through [facility] .   Per test claim: PA required; PA submitted to [facility] via CoverMyMeds Key/confirmation #/EOC [id number] Status is pending

## 2023-01-21 NOTE — Telephone Encounter (Signed)
 Clinical Questions have been submitted

## 2023-01-25 ENCOUNTER — Other Ambulatory Visit (HOSPITAL_COMMUNITY): Payer: Self-pay

## 2023-01-25 NOTE — Telephone Encounter (Signed)
noted 

## 2023-01-25 NOTE — Telephone Encounter (Signed)
Pharmacy Patient Advocate Encounter  Received notification from [facility] that Prior Authorization for Methylphenidate has been APPROVED from 01/21/2023 to 01/19/2026. Ran test claim, Copay is $5.00. This test claim was processed through [facility]- copay amounts may vary at other pharmacies due to pharmacy/plan contracts, or as the patient moves through the different stages of their insurance plan.   PA #/Case ID/Reference #: [id number]

## 2023-01-27 ENCOUNTER — Ambulatory Visit: Payer: BC Managed Care – PPO | Admitting: Internal Medicine

## 2023-02-05 ENCOUNTER — Other Ambulatory Visit: Payer: Self-pay | Admitting: Internal Medicine

## 2023-02-18 ENCOUNTER — Other Ambulatory Visit: Payer: Self-pay

## 2023-02-18 DIAGNOSIS — Z1231 Encounter for screening mammogram for malignant neoplasm of breast: Secondary | ICD-10-CM

## 2023-02-18 MED ORDER — METHYLPHENIDATE HCL ER (OSM) 27 MG PO TBCR: 27.0000 mg | EXTENDED_RELEASE_TABLET | Freq: Every morning | ORAL | 0 refills | Status: DC

## 2023-02-18 NOTE — Telephone Encounter (Signed)
Spoke with pt to let her know that she is overdue for her mammogram. I have placed the order and pt is aware to call [facility] to schedule.   While on the phone pt wanted to let you know that she is doing good with the increased dose of methylphenidate 27 mg. I would like to see if she could get a refill. Pt is aware that you are out of the office until Monday and has enough medication to get her through.

## 2023-04-16 ENCOUNTER — Other Ambulatory Visit: Payer: Self-pay | Admitting: Internal Medicine

## 2023-04-19 MED ORDER — METHYLPHENIDATE HCL ER (OSM) 27 MG PO TBCR: 27.0000 mg | EXTENDED_RELEASE_TABLET | Freq: Every morning | ORAL | 0 refills | Status: DC

## 2023-07-30 ENCOUNTER — Other Ambulatory Visit: Payer: Self-pay | Admitting: Internal Medicine

## 2023-08-03 ENCOUNTER — Other Ambulatory Visit: Payer: Self-pay | Admitting: Internal Medicine

## 2023-08-03 MED ORDER — METHYLPHENIDATE HCL ER (OSM) 27 MG PO TBCR: 27.0000 mg | EXTENDED_RELEASE_TABLET | Freq: Every morning | ORAL | 0 refills | Status: DC

## 2023-09-14 ENCOUNTER — Other Ambulatory Visit: Payer: Self-pay | Admitting: Internal Medicine

## 2023-09-14 MED ORDER — METHYLPHENIDATE HCL ER (OSM) 27 MG PO TBCR: 27.0000 mg | EXTENDED_RELEASE_TABLET | Freq: Every morning | ORAL | 0 refills | Status: DC

## 2023-10-18 ENCOUNTER — Other Ambulatory Visit: Payer: Self-pay | Admitting: Internal Medicine

## 2023-10-18 NOTE — Telephone Encounter (Signed)
 Copied from CRM [id number]. Topic: Clinical - Medication Refill >> Oct 18, 2023  3:16 PM Sophia H wrote: Medication: methylphenidate  27 MG PO CR tablet **Pt wanting to know if more than 1 refill can be sent in  Has the patient contacted their pharmacy? Yes (Agent: If no, request that the patient contact the pharmacy for the refill. If patient does not wish to contact the pharmacy document the reason why and proceed with request.) (Agent: If yes, when and what did the pharmacy advise?)  This is the patient's preferred pharmacy:   [id number] [address] [place] [address] Phone: [phone] Fax: [phone]  Is this the correct pharmacy for this prescription? Yes If no, delete pharmacy and type the correct one.   Has the prescription been filled recently? Yes  Is the patient out of the medication? Yes  Has the patient been seen for an appointment in the last year OR does the patient have an upcoming appointment? Yes  Can we respond through MyChart? Yes  Agent: Please be advised that Rx refills may take up to 3 business days. We ask that you follow-up with your pharmacy.

## 2023-10-19 MED ORDER — METHYLPHENIDATE HCL ER (OSM) 27 MG PO TBCR: 27.0000 mg | EXTENDED_RELEASE_TABLET | Freq: Every morning | ORAL | 0 refills | Status: DC

## 2023-10-19 NOTE — Telephone Encounter (Signed)
 Requesting: methylphenidate  27 MG PO CR tablet [[id number]]  Contract: No UDS: NO Last Visit: 01/13/2023 Next Visit: Visit date not found Last Refill:   Non-formulary    Last ordered: 1 month ago (09/14/2023) by Thersia Flax, MD    Please Advise

## 2023-11-24 ENCOUNTER — Encounter: Payer: Self-pay | Admitting: Internal Medicine

## 2023-11-24 MED ORDER — METHYLPHENIDATE HCL ER (OSM) 27 MG PO TBCR: 27.0000 mg | EXTENDED_RELEASE_TABLET | Freq: Every morning | ORAL | 0 refills | Status: DC

## 2023-11-24 NOTE — Telephone Encounter (Signed)
 Refilled: 10/19/2023 Last OV: 01/13/2023 Next OV: not scheduled

## 2024-01-05 ENCOUNTER — Encounter: Payer: Self-pay | Admitting: Internal Medicine

## 2024-01-05 ENCOUNTER — Ambulatory Visit: Admitting: Internal Medicine

## 2024-01-05 DIAGNOSIS — F988 Other specified behavioral and emotional disorders with onset usually occurring in childhood and adolescence: Secondary | ICD-10-CM

## 2024-01-05 DIAGNOSIS — Z739 Problem related to life management difficulty, unspecified: Secondary | ICD-10-CM

## 2024-01-05 DIAGNOSIS — E785 Hyperlipidemia, unspecified: Secondary | ICD-10-CM

## 2024-01-05 DIAGNOSIS — Z1159 Encounter for screening for other viral diseases: Secondary | ICD-10-CM

## 2024-01-05 DIAGNOSIS — E8940 Asymptomatic postprocedural ovarian failure: Secondary | ICD-10-CM

## 2024-01-05 DIAGNOSIS — N951 Menopausal and female climacteric states: Secondary | ICD-10-CM

## 2024-01-05 DIAGNOSIS — R7301 Impaired fasting glucose: Secondary | ICD-10-CM | POA: Diagnosis not present

## 2024-01-05 DIAGNOSIS — Z79899 Other long term (current) drug therapy: Secondary | ICD-10-CM | POA: Diagnosis not present

## 2024-01-05 DIAGNOSIS — Z1231 Encounter for screening mammogram for malignant neoplasm of breast: Secondary | ICD-10-CM

## 2024-01-05 DIAGNOSIS — Z90710 Acquired absence of both cervix and uterus: Secondary | ICD-10-CM

## 2024-01-05 DIAGNOSIS — R519 Headache, unspecified: Secondary | ICD-10-CM

## 2024-01-05 MED ORDER — METHYLPHENIDATE HCL ER (OSM) 27 MG PO TBCR: 27.0000 mg | EXTENDED_RELEASE_TABLET | Freq: Every morning | ORAL | 0 refills | Status: DC

## 2024-01-05 NOTE — Patient Instructions (Addendum)
 Think about estrogen therapy.  I'm confirming menopause today with FSH/LH

## 2024-01-05 NOTE — Progress Notes (Unsigned)
 Subjective:  Patient ID: Nicole Camacho, female    DOB: 03/07/1971  Age: 53 y.o. MRN: [id number]  CC: The primary encounter diagnosis was Long-term use of high-risk medication. 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   HPI Nicole Camacho presents for  Chief Complaint  Patient presents with   Medical Management of Chronic Issues    6 month follow up     1) ADD: suspended medication for 2 months last fall due to personality changes that she later attributed to menopause bc of other symptoms.  Has resumed as of January . 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 30 tablet 11   ketoconazole (NIZORAL) 2 % shampoo Apply 1 Application topically 3 (three) times a week.     methylphenidate  27 MG PO CR tablet Take 1 tablet (27 mg total) by mouth in the morning. 30 tablet 0   nystatin  cream (MYCOSTATIN ) Apply 1 application topically 2 (two) times daily. Apply beneath breasts - to affected area 30 g 0   traZODone  (DESYREL ) 50 MG tablet TAKE 1/2 TO 1 TABLET BY MOUTH AT BEDTIME AS NEEDED FOR SLEEP 90 tablet 0   triamcinolone  cream (KENALOG ) 0.1 % Apply 1 Application topically 2 (two) times daily.     No facility-administered medications prior to visit.    Review of  Systems;  Patient denies headache, fevers, malaise, unintentional weight loss, skin rash, eye pain, sinus congestion and sinus pain, sore throat, dysphagia,  hemoptysis , cough, dyspnea, wheezing, chest pain, palpitations, orthopnea, edema, abdominal pain, nausea, melena, diarrhea, constipation, flank pain, dysuria, hematuria, urinary  Frequency, nocturia, numbness, tingling, seizures,  Focal weakness, Loss of consciousness,  Tremor, insomnia, depression, anxiety, and suicidal ideation.      Objective:  There were no vitals taken for this visit.  BP Readings from Last 3 Encounters:  01/13/23 122/78  05/11/22 130/66  11/27/21 118/80    Wt Readings from Last 3 Encounters:  01/13/23 136 lb (61.7 kg)  05/11/22 136 lb (61.7 kg)  11/27/21 131 lb 6.4 oz (59.6 kg)    Physical Exam Vitals reviewed.  Constitutional:      General: She is not in acute distress.    Appearance: Normal appearance. She is normal weight. She is not ill-appearing, toxic-appearing or diaphoretic.  HENT:     Head: Normocephalic.  Eyes:     General: No scleral icterus.       Right eye: No discharge.        Left eye: No discharge.     Conjunctiva/sclera: Conjunctivae normal.  Cardiovascular:     Rate and Rhythm: Normal rate and regular rhythm.     Heart sounds: Normal heart sounds.  Pulmonary:     Effort: Pulmonary effort is normal. No respiratory distress.     Breath sounds: Normal breath sounds.  Musculoskeletal:  General: Normal range of motion.  Skin:    General: Skin is warm and dry.  Neurological:     General: No focal deficit present.     Mental Status: She is alert and oriented to person, place, and time. Mental status is at baseline.  Psychiatric:        Mood and Affect: Mood normal.        Behavior: Behavior normal.        Thought Content: Thought content normal.        Judgment: Judgment normal.     Lab Results  Component Value Date   HGBA1C 5.5 11/27/2021   HGBA1C 5.2 05/29/2021    HGBA1C 5.2 05/18/2019    Lab Results  Component Value Date   CREATININE 0.81 11/27/2021   CREATININE 0.77 05/29/2021   CREATININE 0.72 05/29/2020    Lab Results  Component Value Date   WBC 4.5 11/27/2021   HGB 14.7 11/27/2021   HCT 44.2 11/27/2021   PLT 203 11/27/2021   GLUCOSE 97 11/27/2021   CHOL 244 (H) 11/27/2021   TRIG 51 11/27/2021   HDL 101 11/27/2021   LDLDIRECT 130 (H) 11/27/2021   LDLCALC 135 (H) 11/27/2021   ALT 22 11/27/2021   AST 19 11/27/2021   NA 143 11/27/2021   K 4.2 11/27/2021   CL 101 11/27/2021   CREATININE 0.81 11/27/2021   BUN 18 11/27/2021   CO2 24 11/27/2021   TSH 1.010 11/27/2021   HGBA1C 5.5 11/27/2021    No results found.  Assessment & Plan:  .Long-term use of high-risk medication -     CBC with Differential/Platelet -     TSH  Impaired fasting glucose -     Comprehensive metabolic panel with GFR -     Hemoglobin A1c  Dyslipidemia -     Lipid panel -     LDL cholesterol, direct  Encounter for hepatitis C screening test for low risk patient -     Hepatitis C antibody  Breast cancer screening by mammogram -     Digital Screening Mammogram, Left and Right; Future  Vasomotor symptoms due to menopause -     FSH/LH; Future  Headache disorder Assessment & Plan:  headaches still occurring less than  ten times per month.  Managed with aspirin and Fioricet prn  Occasionally adds Flexeril  for trapezius spasm. Nicole Camacho Has not refilled the fioricet in several months per Nicole Camacho database.    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    Attention deficit disorder of adult Assessment & Plan: She suspending medication for 2 months  last fall,  resumed medication in January due to recurrence of concentration deficits. Continue  Metadate  ER 27 mg   Post hysterectomy menopause syndrome Assessment & Plan: Checking FSH/LH.   Symptoms include flushing , decreased libido, brain  fog      I spent 34 minutes on the day of this face to face encounter reviewing patient's  most recent visit with cardiology,  nephrology,  and neurology,  prior relevant surgical and non surgical procedures, recent  labs and imaging studies, counseling on weight management,  reviewing the assessment and plan with patient, and post visit ordering and reviewing of  diagnostics and therapeutics with patient  .   Follow-up: No follow-ups on file.   Verneita LITTIE Kettering, MD

## 2024-01-05 NOTE — Assessment & Plan Note (Addendum)
 headaches still occurring less than  ten times per month.  Managed with aspirin and Fioricet prn  Occasionally adds Flexeril  for trapezius spasm. [name] Has not refilled the fioricet in several months per [place] database.

## 2024-01-05 NOTE — Assessment & Plan Note (Signed)
 Checking FSH/LH.   Symptoms include flushing , decreased libido, brain fog

## 2024-01-05 NOTE — Assessment & Plan Note (Signed)
 Improving  with working from home.

## 2024-01-05 NOTE — Assessment & Plan Note (Signed)
 She suspending medication for 2 months  last fall,  resumed medication in January due to recurrence of concentration deficits. Continue  Metadate  ER 27 mg

## 2024-01-06 LAB — HEMOGLOBIN A1C: Hgb A1c MFr Bld: 5.7 % (ref 4.6–6.5)

## 2024-01-06 LAB — COMPREHENSIVE METABOLIC PANEL WITH GFR
ALT: 21 U/L (ref 0–35)
AST: 20 U/L (ref 0–37)
Albumin: 4.9 g/dL (ref 3.5–5.2)
Alkaline Phosphatase: 39 U/L (ref 39–117)
BUN: 15 mg/dL (ref 6–23)
CO2: 29 meq/L (ref 19–32)
Calcium: 10.1 mg/dL (ref 8.4–10.5)
Chloride: 100 meq/L (ref 96–112)
Creatinine, Ser: 0.75 mg/dL (ref 0.40–1.20)
GFR: 90.65 mL/min (ref >60.00)
Glucose, Bld: 88 mg/dL (ref 70–99)
Potassium: 4.6 meq/L (ref 3.5–5.1)
Sodium: 142 meq/L (ref 135–145)
Total Bilirubin: 0.4 mg/dL (ref 0.2–1.2)
Total Protein: 7.3 g/dL (ref 6.0–8.3)

## 2024-01-06 LAB — CBC WITH DIFFERENTIAL/PLATELET
Basophils Absolute: 0.1 K/uL (ref 0.0–0.1)
Basophils Relative: 1.2 % (ref 0.0–3.0)
Eosinophils Absolute: 0.1 K/uL (ref 0.0–0.7)
Eosinophils Relative: 1.9 % (ref 0.0–5.0)
HCT: 44.1 % (ref 36.0–46.0)
Hemoglobin: 14.5 g/dL (ref 12.0–15.0)
Lymphocytes Relative: 20.5 % (ref 12.0–46.0)
Lymphs Abs: 1.2 K/uL (ref 0.7–4.0)
MCHC: 32.9 g/dL (ref 30.0–36.0)
MCV: 90.3 fl (ref 78.0–100.0)
Monocytes Absolute: 0.5 K/uL (ref 0.1–1.0)
Monocytes Relative: 8.4 % (ref 3.0–12.0)
Neutro Abs: 4.1 K/uL (ref 1.4–7.7)
Neutrophils Relative %: 68 % (ref 43.0–77.0)
Platelets: 247 K/uL (ref 150.0–400.0)
RBC: 4.88 Mil/uL (ref 3.87–5.11)
RDW: 12.6 % (ref 11.5–15.5)
WBC: 6 K/uL (ref 4.0–10.5)

## 2024-01-06 LAB — LIPID PANEL
Cholesterol: 262 mg/dL — ABNORMAL HIGH (ref 0–200)
HDL: 82.7 mg/dL (ref >39.00)
LDL Cholesterol: 161 mg/dL — ABNORMAL HIGH (ref 0–99)
NonHDL: 179.33
Total CHOL/HDL Ratio: 3
Triglycerides: 90 mg/dL (ref 0.0–149.0)
VLDL: 18 mg/dL (ref 0.0–40.0)

## 2024-01-06 LAB — HEPATITIS C ANTIBODY: Hepatitis C Ab: NONREACTIVE

## 2024-01-06 LAB — LDL CHOLESTEROL, DIRECT: Direct LDL: 147 mg/dL

## 2024-01-06 LAB — TSH: TSH: 0.67 u[IU]/mL (ref 0.35–5.50)

## 2024-01-07 ENCOUNTER — Ambulatory Visit: Payer: Self-pay | Admitting: Internal Medicine

## 2024-01-11 ENCOUNTER — Ambulatory Visit: Admitting: Internal Medicine

## 2024-01-14 ENCOUNTER — Encounter: Payer: Self-pay | Admitting: Internal Medicine

## 2024-01-14 ENCOUNTER — Ambulatory Visit: Admitting: Internal Medicine

## 2024-01-14 VITALS — BP 120/80 | HR 84 | Ht 62.0 in | Wt 140.0 lb

## 2024-01-14 DIAGNOSIS — Z0000 Encounter for general adult medical examination without abnormal findings: Secondary | ICD-10-CM

## 2024-01-14 DIAGNOSIS — E8940 Asymptomatic postprocedural ovarian failure: Secondary | ICD-10-CM | POA: Diagnosis not present

## 2024-01-14 DIAGNOSIS — Z90710 Acquired absence of both cervix and uterus: Secondary | ICD-10-CM

## 2024-01-14 DIAGNOSIS — N951 Menopausal and female climacteric states: Secondary | ICD-10-CM

## 2024-01-14 DIAGNOSIS — Z1231 Encounter for screening mammogram for malignant neoplasm of breast: Secondary | ICD-10-CM

## 2024-01-14 LAB — FSH/LH
FSH: 109.7 m[IU]/mL
LH: 59.2 m[IU]/mL

## 2024-01-14 LAB — HM MAMMOGRAPHY

## 2024-01-14 MED ORDER — METHYLPHENIDATE HCL ER (OSM) 27 MG PO TBCR: 27.0000 mg | EXTENDED_RELEASE_TABLET | Freq: Every morning | ORAL | 0 refills | Status: DC

## 2024-01-14 MED ORDER — ESCITALOPRAM OXALATE 5 MG PO TABS: 5.0000 mg | ORAL_TABLET | Freq: Every day | ORAL | 1 refills | Status: AC

## 2024-01-14 MED ORDER — METHYLPHENIDATE HCL ER (OSM) 27 MG PO TBCR
27.0000 mg | EXTENDED_RELEASE_TABLET | Freq: Every morning | ORAL | 0 refills | Status: DC
Start: 2024-01-14 — End: 2024-04-19

## 2024-01-14 MED ORDER — TRAZODONE HCL 50 MG PO TABS: 25.0000 mg | ORAL_TABLET | Freq: Every evening | ORAL | 0 refills | Status: DC | PRN

## 2024-01-14 MED ORDER — CYCLOBENZAPRINE HCL 10 MG PO TABS: 10.0000 mg | ORAL_TABLET | Freq: Three times a day (TID) | ORAL | 11 refills | Status: AC | PRN

## 2024-01-14 MED ORDER — NYSTATIN 100000 UNIT/GM EX POWD
1.0000 | Freq: Two times a day (BID) | CUTANEOUS | 0 refills | Status: AC
Start: 2024-01-14 — End: ?

## 2024-01-14 NOTE — Progress Notes (Unsigned)
 Patient ID: Nicole Camacho, female    DOB: May 08, 1971  Age: 53 y.o. MRN: [id number]  The patient is here for annual preventive examination and management of other chronic and acute problems.   The risk factors are reflected in the social history.   The roster of all physicians providing medical care to patient - is listed in the Snapshot section of the chart.   Activities of daily living:  The patient is 100% independent in all ADLs: dressing, toileting, feeding as well as independent mobility   Home safety : The patient has smoke detectors in the home. They wear seatbelts.  There are no unsecured firearms at home. There is no violence in the home.    There is no risks for hepatitis, STDs or HIV. There is no   history of blood transfusion. They have no travel history to infectious disease endemic areas of the world.   The patient has seen their dentist in the last six month. They have seen their eye doctor in the last year. The patinet  denies slight hearing difficulty with regard to whispered voices and some television programs.  They have deferred audiologic testing in the last year.  They do not  have excessive sun exposure. Discussed the need for sun protection: hats, long sleeves and use of sunscreen if there is significant sun exposure.    Diet: the importance of a healthy diet is discussed. They do have a healthy diet.   The benefits of regular aerobic exercise were discussed. The patient  exercises  3 to 5 days per week  for  60 minutes.    Depression screen: there are no signs or vegative symptoms of depression- irritability, change in appetite, anhedonia, sadness/tearfullness.   The following portions of the patient's history were reviewed and updated as appropriate: allergies, current medications, past family history, past medical history,  past surgical history, past social history  and problem list.   Visual acuity was not assessed per patient preference since the patient has  regular follow up with an  ophthalmologist. Hearing and body mass index were assessed and reviewed.    During the course of the visit the patient was educated and counseled about appropriate screening and preventive services including : fall prevention , diabetes screening, nutrition counseling, colorectal cancer screening, and recommended immunizations.    Chief Complaint:   none   Review of Symptoms  Patient denies headache, fevers, malaise, unintentional weight loss, skin rash, eye pain, sinus congestion and sinus pain, sore throat, dysphagia,  hemoptysis , cough, dyspnea, wheezing, chest pain, palpitations, orthopnea, edema, abdominal pain, nausea, melena, diarrhea, constipation, flank pain, dysuria, hematuria, urinary  Frequency, nocturia, numbness, tingling, seizures,  Focal weakness, Loss of consciousness,  Tremor, insomnia, depression, anxiety, and suicidal ideation.    Physical Exam:  BP 120/80   Pulse 84   Ht 5' 2 (1.575 m)   Wt 140 lb (63.5 kg)   SpO2 96%   BMI 25.61 kg/m    Physical Exam Vitals reviewed.  Constitutional:      General: She is not in acute distress.    Appearance: Normal appearance. She is normal weight. She is not ill-appearing, toxic-appearing or diaphoretic.  HENT:     Head: Normocephalic.  Eyes:     General: No scleral icterus.       Right eye: No discharge.        Left eye: No discharge.     Conjunctiva/sclera: Conjunctivae normal.  Cardiovascular:     Rate  and Rhythm: Normal rate and regular rhythm.     Heart sounds: Normal heart sounds.  Pulmonary:     Effort: Pulmonary effort is normal. No respiratory distress.     Breath sounds: Normal breath sounds.  Musculoskeletal:        General: Normal range of motion.  Skin:    General: Skin is warm and dry.  Neurological:     General: No focal deficit present.     Mental Status: She is alert and oriented to person, place, and time. Mental status is at baseline.  Psychiatric:        Mood and  Affect: Mood normal.        Behavior: Behavior normal.        Thought Content: Thought content normal.        Judgment: Judgment normal.     Assessment and Plan: Encounter for screening mammogram for malignant neoplasm of breast  Vasomotor symptoms due to menopause -     FSH/LH  Encounter for annual physical exam Assessment & Plan: age appropriate education and counseling updated, referrals for preventative services and immunizations addressed, dietary and smoking counseling addressed, most recent labs reviewed.  I have personally reviewed and have noted:   1) the patient's medical and social history 2) The pt's use of alcohol, tobacco, and illicit drugs 3) The patient's current medications and supplements 4) Functional ability including ADL's, fall risk, home safety risk, hearing and visual impairment 5) Diet and physical activities 6) Evidence for depression or mood disorder 7) The patient's height, weight, and BMI have been recorded in the chart     I have made referrals, and provided counseling and education based on review of the above    Post hysterectomy menopause syndrome Assessment & Plan: Starting lexapro  for Symptoms include flushing , decreased libido, brain fog   Other orders -     Cyclobenzaprine  HCl; Take 1 tablet (10 mg total) by mouth 3 (three) times daily as needed for muscle spasms.  Dispense: 30 tablet; Refill: 11 -     traZODone  HCl; Take 0.5-1 tablets (25-50 mg total) by mouth at bedtime as needed. for sleep  Dispense: 90 tablet; Refill: 0 -     Escitalopram  Oxalate; Take 1 tablet (5 mg total) by mouth daily.  Dispense: 90 tablet; Refill: 1 -     Nystatin ; Apply 1 Application topically 2 (two) times daily. To rash until resolved.  Dispense: 30 g; Refill: 0 -     Methylphenidate  HCl ER (OSM); Take 1 tablet (27 mg total) by mouth in the morning.  Dispense: 30 tablet; Refill: 0 -     Methylphenidate  HCl ER (OSM); Take 1 tablet (27 mg total) by mouth in the  morning.  Dispense: 30 tablet; Refill: 0 -     Methylphenidate  HCl ER (OSM); Take 1 tablet (27 mg total) by mouth in the morning.  Dispense: 30 tablet; Refill: 0    Return in about 6 months (around 07/16/2024).  Verneita LITTIE Kettering, MD

## 2024-01-14 NOTE — Patient Instructions (Addendum)
 Please start the Lexapro  (escitalopram ) at   5 mg  daily with lunch   You may double the dose after a few weeks if you feel no effect.   Mehtylphenidate refills sent (they willl only take 2 at a time)    You might want to try using Relaxium for insomnia  (as seen on TV commercials) instead of trazodone   . It is available through [employer] and contains all natural supplements:  Melatonin 5 mg  Chamomile 25 mg Passionflower extract 75 mg GABA 100 mg Ashwaganda extract 125 mg Magnesium citrate, glycinate, oxide (100 mg)  L tryptophan 500 mg Valerest (proprietary  ingredient ; probably valeria root extract)

## 2024-01-17 ENCOUNTER — Encounter: Payer: Self-pay | Admitting: Internal Medicine

## 2024-01-17 NOTE — Assessment & Plan Note (Signed)

## 2024-01-17 NOTE — Assessment & Plan Note (Signed)
 Starting lexapro  for Symptoms include flushing , decreased libido, brain fog

## 2024-01-25 ENCOUNTER — Ambulatory Visit
Admission: EM | Admit: 2024-01-25 | Discharge: 2024-01-25 | Disposition: A | Discharge status: Home / Self Care | Attending: Emergency Medicine | Admitting: Emergency Medicine

## 2024-01-25 ENCOUNTER — Encounter: Payer: Self-pay | Admitting: Emergency Medicine

## 2024-01-25 DIAGNOSIS — W57XXXA Bitten or stung by nonvenomous insect and other nonvenomous arthropods, initial encounter: Secondary | ICD-10-CM | POA: Diagnosis not present

## 2024-01-25 DIAGNOSIS — S30861A Insect bite (nonvenomous) of abdominal wall, initial encounter: Secondary | ICD-10-CM | POA: Diagnosis not present

## 2024-01-25 MED ORDER — DOXYCYCLINE HYCLATE 100 MG PO CAPS: 100.0000 mg | ORAL_CAPSULE | Freq: Two times a day (BID) | ORAL | 0 refills | Status: AC
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
## 2024-01-25 NOTE — ED Triage Notes (Signed)
 Patient reports tick bite on abdomen 10 days ago. Patient now has redness and feeling fatigue. Denies pain.

## 2024-01-25 NOTE — Discharge Instructions (Addendum)
 Today you were evaluated for your tick bite, concerning that symptoms are progressively worsening and therefore you will be started on antibiotic  Take doxycycline  twice daily for 7 days  For joint aching you may continue use of ibuprofen and/or Tylenol  as it has been helpful  Blood work to check for Lyme disease and [facility] spotted fever are pending, you will be notified of positive test results only and given further instruction, more information in your packet regarding these conditions

## 2024-01-27 LAB — SPECIMEN STATUS REPORT

## 2024-01-27 LAB — LYME DISEASE SEROLOGY W/REFLEX: Lyme Total Antibody EIA: NEGATIVE

## 2024-01-27 LAB — SPOTTED FEVER GROUP ANTIBODIES
Spotted Fever Group IgG: <1:64 {titer}
Spotted Fever Group IgM: <1:64 {titer}

## 2024-01-28 LAB — LYME DISEASE SEROLOGY W/REFLEX

## 2024-01-28 LAB — SPECIMEN STATUS REPORT

## 2024-02-21 ENCOUNTER — Encounter: Admitting: Internal Medicine

## 2024-04-09 ENCOUNTER — Other Ambulatory Visit: Payer: Self-pay | Admitting: Internal Medicine

## 2024-04-14 ENCOUNTER — Encounter: Payer: Self-pay | Admitting: Internal Medicine

## 2024-04-14 ENCOUNTER — Other Ambulatory Visit: Payer: Self-pay

## 2024-04-14 DIAGNOSIS — F988 Other specified behavioral and emotional disorders with onset usually occurring in childhood and adolescence: Secondary | ICD-10-CM

## 2024-04-14 NOTE — Telephone Encounter (Signed)
 Pt wanting a refill on methylphenidate  er which I have pended it for you

## 2024-04-19 ENCOUNTER — Other Ambulatory Visit: Payer: Self-pay

## 2024-04-19 MED ORDER — METHYLPHENIDATE HCL ER (OSM) 27 MG PO TBCR: 27.0000 mg | EXTENDED_RELEASE_TABLET | Freq: Every morning | ORAL | 0 refills | Status: DC

## 2024-04-19 NOTE — Telephone Encounter (Signed)
 Refilled: 01/14/2024 Last OV: 01/14/2024 Next OV: 07/17/2024

## 2024-04-19 NOTE — Addendum Note (Signed)
 Addended by: HARRIETTE RAISIN on: 04/19/2024 04:21 PM   Modules accepted: Orders

## 2024-04-19 NOTE — Telephone Encounter (Signed)
 Copied from CRM #[id number]. Topic: Clinical - Medication Question >> Apr 19, 2024  1:38 PM Aisha D wrote: Reason for CRM: Pt is calling to check the status of the medication refill request for the methylphenidate  27 MG PO CR tablet. Pt stated that she requested this on 11/14 but was informed that Dr.Tullo was out of office and will refill the medication once she returns on Monday. Pt stated that she is currently out and would like to have this refilled today if possible. Pt would like a callback with an update.

## 2024-05-19 ENCOUNTER — Encounter: Payer: Self-pay | Admitting: Internal Medicine

## 2024-05-19 MED ORDER — METHYLPHENIDATE HCL ER (OSM) 27 MG PO TBCR: 27.0000 mg | EXTENDED_RELEASE_TABLET | Freq: Every morning | ORAL | 0 refills | Status: DC

## 2024-06-29 ENCOUNTER — Encounter: Payer: Self-pay | Admitting: Internal Medicine

## 2024-06-30 MED ORDER — METHYLPHENIDATE HCL ER (OSM) 27 MG PO TBCR: 27.0000 mg | EXTENDED_RELEASE_TABLET | Freq: Every morning | ORAL | 0 refills | Status: AC

## 2024-07-17 ENCOUNTER — Ambulatory Visit: Admitting: Internal Medicine
